# Patient Record
Sex: Male | Born: 1960 | Race: White | Hispanic: No | Marital: Married | Smoking: Former smoker
Health system: Southern US, Community
[De-identification: ages and names within clinical notes are randomized; demographics above are authoritative.]

## PROBLEM LIST (undated history)

## (undated) DIAGNOSIS — I639 Cerebral infarction, unspecified: Secondary | ICD-10-CM

## (undated) DIAGNOSIS — J45909 Unspecified asthma, uncomplicated: Secondary | ICD-10-CM

## (undated) DIAGNOSIS — I10 Essential (primary) hypertension: Secondary | ICD-10-CM

## (undated) HISTORY — DX: Cerebral infarction, unspecified: I63.9

---

## 1998-01-22 HISTORY — PX: ANKLE SURGERY: SHX546

## 2007-01-23 HISTORY — PX: ROTATOR CUFF REPAIR: SHX139

## 2008-12-23 ENCOUNTER — Ambulatory Visit: Payer: Self-pay

## 2009-01-17 ENCOUNTER — Ambulatory Visit: Payer: Self-pay | Admitting: Unknown Physician Specialty

## 2009-01-20 ENCOUNTER — Ambulatory Visit: Payer: Self-pay | Admitting: Unknown Physician Specialty

## 2013-09-22 ENCOUNTER — Inpatient Hospital Stay (HOSPITAL_COMMUNITY)
Admission: EM | Admit: 2013-09-22 | Discharge: 2013-09-25 | DRG: 064 | Disposition: A | Discharge status: Rehabilitation Facility | Payer: PRIVATE HEALTH INSURANCE | Attending: Neurology | Admitting: Neurology

## 2013-09-22 ENCOUNTER — Encounter (HOSPITAL_COMMUNITY): Payer: Self-pay | Admitting: Emergency Medicine

## 2013-09-22 ENCOUNTER — Emergency Department (HOSPITAL_COMMUNITY): Payer: PRIVATE HEALTH INSURANCE

## 2013-09-22 DIAGNOSIS — E785 Hyperlipidemia, unspecified: Secondary | ICD-10-CM | POA: Diagnosis present

## 2013-09-22 DIAGNOSIS — F17200 Nicotine dependence, unspecified, uncomplicated: Secondary | ICD-10-CM | POA: Diagnosis present

## 2013-09-22 DIAGNOSIS — R4701 Aphasia: Secondary | ICD-10-CM | POA: Diagnosis present

## 2013-09-22 DIAGNOSIS — G936 Cerebral edema: Secondary | ICD-10-CM | POA: Diagnosis present

## 2013-09-22 DIAGNOSIS — G4733 Obstructive sleep apnea (adult) (pediatric): Secondary | ICD-10-CM | POA: Diagnosis present

## 2013-09-22 DIAGNOSIS — I619 Nontraumatic intracerebral hemorrhage, unspecified: Secondary | ICD-10-CM | POA: Diagnosis not present

## 2013-09-22 DIAGNOSIS — R471 Dysarthria and anarthria: Secondary | ICD-10-CM | POA: Diagnosis present

## 2013-09-22 DIAGNOSIS — I10 Essential (primary) hypertension: Secondary | ICD-10-CM | POA: Diagnosis present

## 2013-09-22 DIAGNOSIS — G8190 Hemiplegia, unspecified affecting unspecified side: Secondary | ICD-10-CM | POA: Diagnosis present

## 2013-09-22 DIAGNOSIS — Z5189 Encounter for other specified aftercare: Secondary | ICD-10-CM | POA: Diagnosis not present

## 2013-09-22 HISTORY — DX: Essential (primary) hypertension: I10

## 2013-09-22 LAB — COMPREHENSIVE METABOLIC PANEL
ALK PHOS: 75 U/L (ref 39–117)
ALT: 17 U/L (ref 0–53)
AST: 10 U/L (ref 0–37)
Albumin: 3.6 g/dL (ref 3.5–5.2)
Anion gap: 18 — ABNORMAL HIGH (ref 5–15)
BUN: 19 mg/dL (ref 6–23)
CHLORIDE: 100 meq/L (ref 96–112)
CO2: 19 mEq/L (ref 19–32)
Calcium: 8.6 mg/dL (ref 8.4–10.5)
Creatinine, Ser: 1.24 mg/dL (ref 0.50–1.35)
GFR, EST AFRICAN AMERICAN: 74 mL/min — AB (ref >90)
GFR, EST NON AFRICAN AMERICAN: 64 mL/min — AB (ref >90)
GLUCOSE: 113 mg/dL — AB (ref 70–99)
POTASSIUM: 3.7 meq/L (ref 3.7–5.3)
SODIUM: 137 meq/L (ref 137–147)
Total Bilirubin: 0.4 mg/dL (ref 0.3–1.2)
Total Protein: 6.2 g/dL (ref 6.0–8.3)

## 2013-09-22 LAB — DIFFERENTIAL
Basophils Absolute: 0 10*3/uL (ref 0.0–0.1)
Basophils Relative: 1 % (ref 0–1)
EOS ABS: 0.1 10*3/uL (ref 0.0–0.7)
Eosinophils Relative: 1 % (ref 0–5)
LYMPHS ABS: 1.6 10*3/uL (ref 0.7–4.0)
LYMPHS PCT: 24 % (ref 12–46)
MONOS PCT: 7 % (ref 3–12)
Monocytes Absolute: 0.5 10*3/uL (ref 0.1–1.0)
NEUTROS ABS: 4.7 10*3/uL (ref 1.7–7.7)
NEUTROS PCT: 68 % (ref 43–77)

## 2013-09-22 LAB — CBC
HCT: 47.3 % (ref 39.0–52.0)
HEMOGLOBIN: 16.3 g/dL (ref 13.0–17.0)
MCH: 31.6 pg (ref 26.0–34.0)
MCHC: 34.5 g/dL (ref 30.0–36.0)
MCV: 91.7 fL (ref 78.0–100.0)
Platelets: 110 10*3/uL — ABNORMAL LOW (ref 150–400)
RBC: 5.16 MIL/uL (ref 4.22–5.81)
RDW: 13.9 % (ref 11.5–15.5)
WBC: 6.9 10*3/uL (ref 4.0–10.5)

## 2013-09-22 LAB — CBG MONITORING, ED: GLUCOSE-CAPILLARY: 102 mg/dL — AB (ref 70–99)

## 2013-09-22 LAB — PROTIME-INR
INR: 1.14 (ref 0.00–1.49)
Prothrombin Time: 14.6 seconds (ref 11.6–15.2)

## 2013-09-22 LAB — I-STAT TROPONIN, ED: Troponin i, poc: 0.12 ng/mL (ref 0.00–0.08)

## 2013-09-22 LAB — APTT: aPTT: 26 seconds (ref 24–37)

## 2013-09-22 MED ORDER — ACETAMINOPHEN 650 MG RE SUPP: 650.0000 mg | RECTAL | Status: DC | PRN

## 2013-09-22 MED ORDER — STROKE: EARLY STAGES OF RECOVERY BOOK
Freq: Once | Status: AC
  Administered 2013-09-22: 22:00:00
  Filled 2013-09-22: qty 1

## 2013-09-22 MED ORDER — PANTOPRAZOLE SODIUM 40 MG IV SOLR
40.0000 mg | Freq: Every day | INTRAVENOUS | Status: DC
  Administered 2013-09-23: 40 mg via INTRAVENOUS
  Filled 2013-09-22 (×2): qty 40

## 2013-09-22 MED ORDER — ACETAMINOPHEN 325 MG PO TABS: 650.0000 mg | ORAL_TABLET | ORAL | Status: DC | PRN

## 2013-09-22 MED ORDER — LABETALOL HCL 5 MG/ML IV SOLN
10.0000 mg | INTRAVENOUS | Status: DC | PRN
  Administered 2013-09-24 (×2): 20 mg via INTRAVENOUS
  Administered 2013-09-25: 40 mg via INTRAVENOUS
  Administered 2013-09-25: 20 mg via INTRAVENOUS
  Filled 2013-09-22: qty 8
  Filled 2013-09-22 (×3): qty 4

## 2013-09-22 MED ORDER — NICARDIPINE HCL IN NACL 20-0.86 MG/200ML-% IV SOLN
3.0000 mg/h | INTRAVENOUS | Status: DC
  Administered 2013-09-22: 3 mg/h via INTRAVENOUS
  Administered 2013-09-23: 6 mg/h via INTRAVENOUS
  Administered 2013-09-23 (×2): 8 mg/h via INTRAVENOUS
  Administered 2013-09-23: 6 mg/h via INTRAVENOUS
  Administered 2013-09-23: 8 mg/h via INTRAVENOUS
  Administered 2013-09-23: 12 mg/h via INTRAVENOUS
  Administered 2013-09-23: 8 mg/h via INTRAVENOUS
  Administered 2013-09-25: 5 mg/h via INTRAVENOUS
  Filled 2013-09-22 (×10): qty 200

## 2013-09-22 MED ORDER — SENNOSIDES-DOCUSATE SODIUM 8.6-50 MG PO TABS
1.0000 | ORAL_TABLET | Freq: Two times a day (BID) | ORAL | Status: DC
  Administered 2013-09-23 – 2013-09-24 (×3): 1 via ORAL
  Filled 2013-09-22 (×6): qty 1

## 2013-09-22 MED ORDER — LABETALOL HCL 5 MG/ML IV SOLN
10.0000 mg | Freq: Once | INTRAVENOUS | Status: AC
  Administered 2013-09-22: 10 mg via INTRAVENOUS
  Filled 2013-09-22: qty 4

## 2013-09-22 NOTE — Consult Note (Signed)
Referring Physician: ED    Chief Complaint: code stroke, right hemiparesis, dyasarthria  HPI:                                                                                                                                         Jay Wise is an 53 y.o. male with a past medical history significant for untreated HTN, smoking, alcohol use, brought in by EMS as a code stroke due to acute onset of the above stated symtoms. Wife is at the bedside and tells me that Jay Wise just got home, went to the bathroom, and when he came out was weak in the right side and with very slurred speech. She said that he was alert and did not complain of HA, vertigo, double vision, confusion, visual disturbances, CP, SOB, or palpitations. Wife said that he reluctantly allow her to call EMS who report that he was dysarthric with right hemiparesis, and SBP 210. NIHSS 10. CT brain revealed a left thalamus 3.3 x 1.7 cm intraventricular extension and mild LEFT-to-RIGHT midline shift but not hydrocephalus. PT/INR, PTT, platelet count pending. At this time, he is alert, awake, following commands, with SBP 248 requiring IV labetalol 10 mg x 2. Date last known well: 09/22/13 Time last known well: 815 pm tPA Given: no, ICH NIHSS: 10  Past Medical History  Diagnosis Date  . Hypertension     No past surgical history on file.  No family history on file. Social History:  reports that he has been smoking.  He does not have any smokeless tobacco history on file. He reports that he drinks alcohol. He reports that he does not use illicit drugs.  Allergies:  Allergies  Allergen Reactions  . Penicillins   . Sulfa Antibiotics     Medications:                                                                                                                           I have reviewed the patient's current medications.  ROS:  History obtained fromchart review and wife  General ROS: negative for - chills, fatigue, fever, night sweats, or weight loss Psychological ROS: negative for - behavioral disorder, hallucinations, memory difficulties, mood swings or suicidal ideation Ophthalmic ROS: negative for - blurry vision, double vision, eye pain or loss of vision ENT ROS: negative for - epistaxis, nasal discharge, oral lesions, sore throat, tinnitus or vertigo Allergy and Immunology ROS: negative for - hives or itchy/watery eyes Hematological and Lymphatic ROS: negative for - bleeding problems, bruising or swollen lymph nodes Endocrine ROS: negative for - galactorrhea, hair pattern changes, polydipsia/polyuria or temperature intolerance Respiratory ROS: negative for - cough, hemoptysis, shortness of breath or wheezing Cardiovascular ROS: negative for - chest pain, dyspnea on exertion, edema or irregular heartbeat Gastrointestinal ROS: negative for - abdominal pain, diarrhea, hematemesis, nausea/vomiting or stool incontinence Genito-Urinary ROS: negative for - dysuria, hematuria, incontinence or urinary frequency/urgency Musculoskeletal ROS: negative for - joint swelling Neurological ROS: as noted in HPI Dermatological ROS: negative for rash and skin lesion changes  Physical exam: pleasant male in no apparent distress. SBP 248, P 82 R 17, afebrile Head: normocephalic. Neck: supple, no bruits, no JVD. Cardiac: no murmurs. Lungs: clear. Abdomen: soft, no tender, no mass. Extremities: no edema.  Neurologic Examination:                                                                                                      General: Mental Status: Alert, oriented x 4. Marked dysarthria.  Able to follow 3 step commands without difficulty. Cranial Nerves: II: Discs flat bilaterally; Visual fields grossly normal, pupils equal, round, reactive to light and accommodation III,IV, VI: ptosis not  present, extra-ocular motions intact bilaterally V,VII: smile symmetric, facial light touch sensation normal bilaterally VIII: hearing normal bilaterally IX,X: gag reflex present XI: bilateral shoulder shrug XII: midline tongue extension without atrophy or fasciculations Motor: Significant for right HP arm greater than leg Tone diminished right side Sensory: Pinprick and light touch slightly diminished right side Deep Tendon Reflexes:  2 all over Plantars: Right: upgoing   Left: downgoing Cerebellar: normal finger-to-nose,  normal heel-to-shin test in the left. Unable to perform in the right due to weakness. Gait:  No tested    Results for orders placed during the hospital encounter of 09/22/13 (from the past 48 hour(s))  CBG MONITORING, ED     Status: Abnormal   Collection Time    09/22/13  9:27 PM      Result Value Ref Range   Glucose-Capillary 102 (*) 70 - 99 mg/dL   Comment 1 Documented in Chart     Comment 2 Notify RN    Rosezena Sensor, ED     Status: Abnormal   Collection Time    09/22/13  9:36 PM      Result Value Ref Range   Troponin i, poc 0.12 (*) 0.00 - 0.08 ng/mL   Comment NOTIFIED PHYSICIAN     Comment 3            Comment: Due to the release kinetics of cTnI,  a negative result within the first hours     of the onset of symptoms does not rule out     myocardial infarction with certainty.     If myocardial infarction is still suspected,     repeat the test at appropriate intervals.   Ct Head (brain) Wo Contrast  09/22/2013   CLINICAL DATA:  Code stroke, RIGHT-sided weakness, LEFT facial droop  EXAM: CT HEAD WITHOUT CONTRAST  TECHNIQUE: Contiguous axial images were obtained from the base of the skull through the vertex without intravenous contrast.  COMPARISON:  None.  FINDINGS: Generalized atrophy.  Scattered small vessel chronic ischemic changes of deep cerebral white matter.  Large area of intraparenchymal hemorrhage identified, question originating from  LEFT thalamus extending into white matter, measuring approximately 3.3 x 1.7 cm image 17.  Intraventricular extension of hemorrhage into LEFT lateral ventricle.  5 mm of LEFT-to-RIGHT midline shift.  No definite hydrocephalus.  Question small old white matter infarct RIGHT periventricular.  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  No additional intracranial hemorrhage or extra-axial collections.  No focal mass lesion or additional evidence of infarction.  Bones and sinuses unremarkable.  IMPRESSION: Acute intraparenchymal hemorrhage at LEFT thalamus 3.3 x 1.7 cm with evidence of intraventricular extension and mild LEFT-to-RIGHT midline shift.  Small vessel chronic ischemic changes of deep cerebral white matter.  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  Critical Value/emergent results were called by telephone at the time of interpretation on 09/22/2013 at 9:40 pm to Dr. Leroy Kennedy, who verbally acknowledged these results.   Electronically Signed   By: Ulyses Southward M.D.   On: 09/22/2013 21:43    Assessment: 53 y.o. male with acute onset right hemiparesis and dysarthria in the context of hypertensive left BG ICH. Preserved mental status, no hydrocephalus or significant mass effect on CT. Admit to NICU. IV nicardipine with goal SBP < 140. No antiplatelets/anticioagulants. CT brain in am. Supportive treatment. Stroke team will follow up in am.  Stroke Risk Factors - HTN, smoking    Wyatt Portela, MD [facility] [phone]  09/22/2013, 9:50 PM

## 2013-09-22 NOTE — ED Provider Notes (Signed)
CSN: [id number]     Arrival date & time 09/22/13  2126 History   First MD Initiated Contact with Patient 09/22/13 2127     Chief Complaint  Patient presents with  . Code Stroke    HPI Jay Wise is a 53 y.o. male presents via EMS after acute onset of left facial droop and RUE weakness at 2015, 1hr PTA.  CODE STROKE called. Last known normal 2015.  Wife noted facial droop and right arm weakness with slurred speech.  No HA or vision changes. FSBG wnl.  HTN per EMS to SBP 210.      Past Medical History  Diagnosis Date  . Hypertension    No past surgical history on file. No family history on file. History  Substance Use Topics  . Smoking status: Not on file  . Smokeless tobacco: Not on file  . Alcohol Use: Not on file    Review of Systems  Level 5 acuity.  Allergies  Review of patient's allergies indicates not on file.  Home Medications   Prior to Admission medications   Not on File   BP 189/113  Pulse 63  Temp(Src) 98.1 F (36.7 C) (Oral)  Resp 26  SpO2 95% Physical Exam  Nursing note and vitals reviewed. Constitutional: He is oriented to person, place, and time. He appears well-developed and well-nourished.  HENT:  Head: Normocephalic and atraumatic.  Nose: Nose normal.  Eyes: Conjunctivae and EOM are normal. Pupils are equal, round, and reactive to light.  Neck: Normal range of motion. Neck supple. No tracheal deviation present.  Cardiovascular: Normal rate, regular rhythm and normal heart sounds.   No murmur heard. Pulmonary/Chest: Effort normal and breath sounds normal. No respiratory distress. He has no rales.  Abdominal: Soft. Bowel sounds are normal. He exhibits no distension and no mass. There is no tenderness.  Musculoskeletal: Normal range of motion. He exhibits no edema.  Neurological: He is alert and oriented to person, place, and time.  Left facial droop.  Decreased tone on right side.  Weakness of right upper extremity 4/5 compared to normal left.   CN 3-12 intact. Toes upgoing on right, down left. Normal FTN.   Skin: Skin is warm and dry. No rash noted.  Psychiatric: He has a normal mood and affect.   ED Course  Procedures (including critical care time) Labs Review Labs Reviewed  CBC - Abnormal; Notable for the following:    Platelets 110 (*)    All other components within normal limits  COMPREHENSIVE METABOLIC PANEL - Abnormal; Notable for the following:    Glucose, Bld 113 (*)    GFR calc non Af Amer 64 (*)    GFR calc Af Amer 74 (*)    Anion gap 18 (*)    All other components within normal limits  CBG MONITORING, ED - Abnormal; Notable for the following:    Glucose-Capillary 102 (*)    All other components within normal limits  I-STAT TROPOININ, ED - Abnormal; Notable for the following:    Troponin i, poc 0.12 (*)    All other components within normal limits  PROTIME-INR  APTT  DIFFERENTIAL    Imaging Review Ct Head (brain) Wo Contrast  09/22/2013   CLINICAL DATA:  Code stroke, RIGHT-sided weakness, LEFT facial droop  EXAM: CT HEAD WITHOUT CONTRAST  TECHNIQUE: Contiguous axial images were obtained from the base of the skull through the vertex without intravenous contrast.  COMPARISON:  None.  FINDINGS: Generalized atrophy.  Scattered  small vessel chronic ischemic changes of deep cerebral white matter.  Large area of intraparenchymal hemorrhage identified, question originating from LEFT thalamus extending into white matter, measuring approximately 3.3 x 1.7 cm image 17.  Intraventricular extension of hemorrhage into LEFT lateral ventricle.  5 mm of LEFT-to-RIGHT midline shift.  No definite hydrocephalus.  Question small old white matter infarct RIGHT periventricular.  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  No additional intracranial hemorrhage or extra-axial collections.  No focal mass lesion or additional evidence of infarction.  Bones and sinuses unremarkable.  IMPRESSION: Acute intraparenchymal hemorrhage at LEFT  thalamus 3.3 x 1.7 cm with evidence of intraventricular extension and mild LEFT-to-RIGHT midline shift.  Small vessel chronic ischemic changes of deep cerebral white matter.  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  Critical Value/emergent results were called by telephone at the time of interpretation on 09/22/2013 at 9:40 pm to Dr. Leroy Kennedy, who verbally acknowledged these results.   Electronically Signed   By: Ulyses Southward M.D.   On: 09/22/2013 21:43     EKG Interpretation   Date/Time:  Tuesday September 22 2013 22:17:55 EDT Ventricular Rate:  66 PR Interval:  171 QRS Duration: 118 QT Interval:  428 QTC Calculation: 448 R Axis:   87 Text Interpretation:  Sinus rhythm Probable left atrial enlargement LVH  with secondary repolarization abnormality ST depr, consider ischemia,  inferior leads Anterior ST elevation, probably due to LVH Sinus rhythm T  wave inversion Left ventricular hypertrophy Abnormal ekg Confirmed by  Gerhard Munch  MD [phone]) on 09/22/2013 10:50:39 PM      MDM   Final diagnoses:  Stroke due to intracerebral hemorrhage  Severe hypertension   Neurology co managed patient.  Airway intact. FSBG wnl. Markedly HTN on arrival, labetalol given. Last known normal 2000.  CT immediately made available.  Pt with left hemorrhage of thalamus with L-R shift. No recent trauma. BP decreased by 25%.  Exam did not change during ED course.  Admit to NICU, will start nifedipine gtt.   Sofie Rower, MD 09/23/13 [phone]

## 2013-09-22 NOTE — Progress Notes (Signed)
53 yo males, last seen well at 2015 per wife who then witnessed him develop left sided facial droop and right sided weakness. Pt arrived to [facility] ED awake alert with left sided facial droop at ED bay. Pt emergent taken to CT scan which reveal a left thalmus hemorrhage per Dr. Cyril Mourning in CT suite. Once in ED room pt able to state name and birthday but had several garbled speech otherwise and moderate aphasia. NIHSS 10 for mild sensory deficit on right side, weakness in right limbs,mild aphasia and dysarthria. Left side droop not seen during NIHSS. Per wife pt has history of HTN only, and does not take any home meds. He admits to being a smoker. Pt hypertensive, labetalol ordered and given. Cardene gtt ordered to be started. Pt for admit to Neuro ICU tonight per Neurology. Supportive care tonight and CT for AM to reassess hemorrage.

## 2013-09-22 NOTE — ED Notes (Signed)
Patient arrived via [facility] from home. Patient was witnessed at 2015 to develop Left facial droop with Right sided weakness. Patient was hypertensive 210/105.

## 2013-09-22 NOTE — ED Notes (Signed)
Report called to Selena Batten, RN unit 28M.

## 2013-09-23 ENCOUNTER — Encounter (HOSPITAL_COMMUNITY): Payer: Self-pay | Admitting: *Deleted

## 2013-09-23 ENCOUNTER — Inpatient Hospital Stay (HOSPITAL_COMMUNITY): Payer: PRIVATE HEALTH INSURANCE

## 2013-09-23 DIAGNOSIS — I10 Essential (primary) hypertension: Secondary | ICD-10-CM

## 2013-09-23 DIAGNOSIS — I517 Cardiomegaly: Secondary | ICD-10-CM

## 2013-09-23 LAB — BASIC METABOLIC PANEL
Anion gap: 13 (ref 5–15)
BUN: 15 mg/dL (ref 6–23)
CHLORIDE: 103 meq/L (ref 96–112)
CO2: 23 mEq/L (ref 19–32)
Calcium: 8.7 mg/dL (ref 8.4–10.5)
Creatinine, Ser: 1.17 mg/dL (ref 0.50–1.35)
GFR calc Af Amer: 79 mL/min — ABNORMAL LOW (ref >90)
GFR calc non Af Amer: 69 mL/min — ABNORMAL LOW (ref >90)
Glucose, Bld: 113 mg/dL — ABNORMAL HIGH (ref 70–99)
POTASSIUM: 3.9 meq/L (ref 3.7–5.3)
SODIUM: 139 meq/L (ref 137–147)

## 2013-09-23 LAB — TROPONIN I
Troponin I: <0.3 ng/mL (ref <0.30)
Troponin I: 0.41 ng/mL (ref <0.30)
Troponin I: 0.37 ng/mL (ref <0.30)

## 2013-09-23 LAB — LIPID PANEL
CHOL/HDL RATIO: 3.8 ratio
CHOLESTEROL: 169 mg/dL (ref 0–200)
HDL: 44 mg/dL (ref >39)
LDL Cholesterol: 109 mg/dL — ABNORMAL HIGH (ref 0–99)
Triglycerides: 78 mg/dL (ref <150)
VLDL: 16 mg/dL (ref 0–40)

## 2013-09-23 LAB — TSH: TSH: 0.812 u[IU]/mL (ref 0.350–4.500)

## 2013-09-23 LAB — CK TOTAL AND CKMB (NOT AT ARMC)
CK, MB: 2.8 ng/mL (ref 0.3–4.0)
Relative Index: 0.9 (ref 0.0–2.5)
Total CK: 313 U/L — ABNORMAL HIGH (ref 7–232)

## 2013-09-23 LAB — HEMOGLOBIN A1C
HEMOGLOBIN A1C: 5.6 % (ref <5.7)
Mean Plasma Glucose: 114 mg/dL (ref <117)

## 2013-09-23 LAB — MRSA PCR SCREENING: MRSA BY PCR: NEGATIVE

## 2013-09-23 LAB — T4, FREE: FREE T4: 1.07 ng/dL (ref 0.80–1.80)

## 2013-09-23 MED ORDER — LABETALOL HCL 200 MG PO TABS
200.0000 mg | ORAL_TABLET | Freq: Three times a day (TID) | ORAL | Status: DC
  Administered 2013-09-23 (×2): 200 mg via ORAL
  Filled 2013-09-23 (×5): qty 1

## 2013-09-23 MED ORDER — PANTOPRAZOLE SODIUM 40 MG PO TBEC
40.0000 mg | DELAYED_RELEASE_TABLET | Freq: Every day | ORAL | Status: DC
  Administered 2013-09-23 – 2013-09-24 (×2): 40 mg via ORAL
  Filled 2013-09-23 (×2): qty 1

## 2013-09-23 MED ORDER — LISINOPRIL 20 MG PO TABS
20.0000 mg | ORAL_TABLET | Freq: Two times a day (BID) | ORAL | Status: DC
  Administered 2013-09-23 – 2013-09-25 (×4): 20 mg via ORAL
  Filled 2013-09-23 (×6): qty 1

## 2013-09-23 MED ORDER — PNEUMOCOCCAL VAC POLYVALENT 25 MCG/0.5ML IJ INJ
0.5000 mL | INJECTION | INTRAMUSCULAR | Status: AC
  Administered 2013-09-24: 0.5 mL via INTRAMUSCULAR
  Filled 2013-09-23: qty 0.5

## 2013-09-23 MED ORDER — LISINOPRIL 20 MG PO TABS
20.0000 mg | ORAL_TABLET | Freq: Every day | ORAL | Status: DC
  Administered 2013-09-23: 20 mg via ORAL
  Filled 2013-09-23: qty 1

## 2013-09-23 MED ORDER — AMLODIPINE BESYLATE 10 MG PO TABS
10.0000 mg | ORAL_TABLET | Freq: Every day | ORAL | Status: DC
  Administered 2013-09-23 – 2013-09-25 (×3): 10 mg via ORAL
  Filled 2013-09-23 (×3): qty 1

## 2013-09-23 NOTE — Plan of Care (Signed)
Noted that pt troponin was elevated. Actually his troponin was elevated when he was in ER. Repeat EKG showed no significant changes compare to prior. Troponin elevation has been reported after CVA. The proposed mechanisms regarding troponin release in the early phase of cerebrovascular disease include cardiac stress in the setting of CVA, secondary central nervous system activation to primary cerebral ischemia, and cerebral disease-related heart failure. Will continue to trend troponin and CK with CK-MB q6h.   Marvel Plan, MD PhD Stroke Neurology 09/23/2013 9:12 PM

## 2013-09-23 NOTE — ED Provider Notes (Signed)
This patient was seen in conjunction with the resident physician.  The documentation accurately reflects the patient's ED encounter.  On my exam, the patient had a new facial droop, new weakness in his right upper extremity.  Patient's last normal time was approximately 1.5 hours prior to our initial evaluation, which was conducted on the patient's arrival to the emergency department. Notably, the patient has no history of hypertension, but his initial blood pressure was substantially elevated, with a systolic of greater than 200. Patient's care was coordinated after arrival with our neurology team as a code stroke. Patient was taken expeditiously to CT scan. CT scan, reviewed by [facility], and I agree with the interpretation, demonstrates new intracranial hemorrhage with left to right midline shift.  Patient's EKG shows a ventricular rate of 66, sinus, LVH, ST depressions, abnormal A second EKG showed similar characteristics, both of which were abnormal.  Given the hypertension, patient received labetalol, coordinated with neurology, with a decrease in his blood pressure. Given the intracranial hemorrhage, acute stroke, patient required admission to the neurosurgical intensive care.  CRITICAL CARE Performed by: Gerhard Munch Total critical care time: 35 Critical care time was exclusive of separately billable procedures and treating other patients. Critical care was necessary to treat or prevent imminent or life-threatening deterioration. Critical care was time spent personally by me on the following activities: development of treatment plan with patient and/or surrogate as well as nursing, discussions with consultants, evaluation of patient's response to treatment, examination of patient, obtaining history from patient or surrogate, ordering and performing treatments and interventions, ordering and review of laboratory studies, ordering and review of radiographic studies, pulse oximetry and  re-evaluation of patient's condition.    Gerhard Munch, MD 09/23/13 [phone]

## 2013-09-23 NOTE — Progress Notes (Signed)
  Echocardiogram 2D Echocardiogram has been performed.  Leta Jungling M 09/23/2013, 2:05 PM

## 2013-09-23 NOTE — Progress Notes (Signed)
eLink Physician-Brief Progress Note Patient Name: Jay Wise DOB: Mar 13, 1960 MRN: [id number]   Date of Service  09/23/2013  HPI/Events of Note  53 yo male presented with dysarthria and Rt sided weakness.  SBP in ER 210.  CT head showed Lt thalamus hemorrhage.  IV cardene started with improvement in hemodynamics.  No issues with oxygenation.   eICU Interventions  Continue monitoring on current therapy.        [name] 09/23/2013, 2:17 AM

## 2013-09-23 NOTE — Progress Notes (Signed)
UR completed.  Paola Aleshire, RN BSN MHA CCM Trauma/Neuro ICU Case Manager [phone]  

## 2013-09-23 NOTE — Progress Notes (Signed)
Dr. Roda Shutters updated on pt still requiring Cardene at 80 mg/hr for SBP 150s. Oral BP meds given earlier. Oral BP meds adjusted by MD. Will continue to monitor and attempt to wean off drip for SBP <160.    Holly Bodily

## 2013-09-23 NOTE — Progress Notes (Signed)
PT Cancellation Note  Patient Details Name: Jay Wise MRN: [id number] DOB: May 21, 1960   Cancelled Treatment:    Reason Eval/Treat Not Completed: Patient not medically ready (on bedrest, will see once activity updated)   Fabio Asa 09/23/2013, 8:58 AM Charlotte Crumb, PT DPT  [phone]

## 2013-09-23 NOTE — Progress Notes (Signed)
STROKE TEAM PROGRESS NOTE   HISTORY Jay Wise is an 53 y.o. male with a past medical history significant for untreated HTN, smoking, alcohol use, brought in by EMS as a code stroke due to right hemiparesis and dysarthria. Wife is at the bedside and tells me that Jay Wise just got home, went to the bathroom, and when he came out was weak in the right side and with very slurred speech. She said that he was alert and did not complain of HA, vertigo, double vision, confusion, visual disturbances, CP, SOB, or palpitations. Wife said that he reluctantly allow her to call EMS who report that he was dysarthric with right hemiparesis, and SBP 210. NIHSS 10. CT brain revealed a left thalamus 3.3 x 1.7 cm intraventricular extension and mild LEFT-to-RIGHT midline shift but not hydrocephalus.  PT/INR, PTT, platelet count pending. At time of assessment, he is alert, awake, following commands, with SBP 248 requiring IV labetalol 10 mg x 2. Last known well: 09/22/13 at 815 pm. Patient was not administered TPA secondary to hemorrhage. He was admitted to the neuro ICU for further evaluation and treatment.   SUBJECTIVE (INTERVAL HISTORY) His wife is at the bedside.  Overall he feels his condition is gradually improving. His right side muscle strength is better and speech is more fluent than yesterday.  OBJECTIVE  Recent Labs Lab 09/22/13 2127  GLUCAP 102*    Recent Labs Lab 09/22/13 2126  NA 137  K 3.7  CL 100  CO2 19  GLUCOSE 113*  BUN 19  CREATININE 1.24  CALCIUM 8.6    Recent Labs Lab 09/22/13 2126  AST 10  ALT 17  ALKPHOS 75  BILITOT 0.4  PROT 6.2  ALBUMIN 3.6    Recent Labs Lab 09/22/13 2126  WBC 6.9  NEUTROABS 4.7  HGB 16.3  HCT 47.3  MCV 91.7  PLT 110*    Recent Labs Lab 09/23/13 0741  TROPONINI <0.30    Recent Labs  09/22/13 2126  LABPROT 14.6  INR 1.14   No results found for this basename: COLORURINE, APPERANCEUR, LABSPEC, PHURINE, GLUCOSEU, HGBUR,  BILIRUBINUR, KETONESUR, PROTEINUR, UROBILINOGEN, NITRITE, LEUKOCYTESUR,  in the last 72 hours  No results found for this basename: chol, trig, hdl, cholhdl, vldl, ldlcalc   No results found for this basename: HGBA1C   No results found for this basename: labopia, cocainscrnur, labbenz, amphetmu, thcu, labbarb    No results found for this basename: ETH,  in the last 168 hours  Ct Head (brain) Wo Contrast  09/23/2013 Left thalamic hematoma unchanged. Intraventricular hemorrhage again  noted without hydrocephalus. Mild midline shift to the right  measuring 5 mm unchanged.  09/22/2013   Acute intraparenchymal hemorrhage at LEFT thalamus 3.3 x 1.7 cm with evidence of intraventricular extension and mild LEFT-to-RIGHT midline shift.  Small vessel chronic ischemic changes of deep cerebral white matter.  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    2D echo - Left ventricle: The cavity size was normal. Wall thickness was increased in a pattern of severe LVH. Systolic function was normal. The estimated ejection fraction was in the range of 50% to 55%. Wall motion was normal; there were no regional wall motion abnormalities. - Left atrium: The atrium was moderately dilated. - Right atrium: The atrium was moderately dilated.  CUS pending  PHYSICAL EXAM  Temp:  [97.8 F (36.6 C)-98.1 F (36.7 C)] 97.8 F (36.6 C) (09/02 0800) Pulse Rate:  [62-96] 80 (09/02 0830) Resp:  [17-26]  17 (09/02 0830) BP: (138-242)/(72-148) 178/104 mmHg (09/02 0830) SpO2:  [90 %-98 %] 98 % (09/02 0830) Weight:  [196 lb 10.4 oz (89.2 kg)] 196 lb 10.4 oz (89.2 kg) (09/02 0020)  General - Well nourished, well developed, in no apparent distress.  Ophthalmologic - Sharp disc margins OU.  Cardiovascular - Regular rate and rhythm with no murmur.  Mental Status -  Level of arousal and orientation to time, place, and person were intact. Language including expression, naming, repetition,  comprehension was assessed and found to have mild transcortical motor aphasia, able to name and repeat. Attention span and concentration were normal. Fund of Knowledge was assessed and was intact.  Cranial Nerves II - XII - II - Visual field intact OU. III, IV, VI - Extraocular movements intact. V - Facial sensation slightly decreased on the right. VII - mild right nasolabial fold flattening. VIII - Hearing & vestibular intact bilaterally. X - Palate elevates symmetrically. XI - Chin turning & shoulder shrug intact bilaterally. XII - Tongue protrusion intact.  Motor Strength - The patient's strength was 4+/5 RUE and pronator drift was present. 5-/5 RLE. Bulk was normal and fasciculations were absent.   Motor Tone - Muscle tone was assessed at the neck and appendages and was normal.  Reflexes - The patient's reflexes were normal in all extremities and he had no pathological reflexes.  Sensory - Light touch, temperature/pinprick were assessed and were slightly decreased on the right.    Coordination - The patient had normal movements in the hands and feet with no ataxia or dysmetria.  Tremor was absent.  Gait and Station - not tested.   ASSESSMENT/PLAN  Jay Wise is a 53 y.o. male with hx of untreated HTN, smoking, alcohol use, brought in by EMS as a code stroke due to right hemiparesis and dysarthria.  He did not receive IV t-PA due to hemorrhage. 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 Admitted to the ICU. Stroke work up underway.  Stroke:  left thalamic hemorrhage with IVH, cerebral edema with left to right shift. Hemorrhage secondary to malignant hypertension      no scheduled antithrombotics, took [employer] prn prior to admission  Carotid Doppler  pending  2D Echo with EF 50-55%   Free T4 WNL  HgbA1c 5.6  SCDs for VTE prophylaxis  NPO   Bedrest. Keep in bed today. Therapy evals tomorrow if stable  Therapy  needs:  pending  Ongoing aggressive risk factor management  Disposition:  Pending   Malignant Hypertension   BP on arrival 242/148  Given labetolol and placed on cardene drip  Home meds:  None  Will put on po norvasc, lisinopril and labetalol and try to wean off cardene drip  BP 138-242/76-148 past 24h  SBP goal < 160  Unstable  Hyperlipidemia  Home meds:  None  LDL 109   Hold off statin for now due to ICH  Other Stroke Risk Factors Cigarette smoker, advised to stop smoking Smoking cessation counseling provided ETOH use Undiagnosed Obstructive sleep apnea, needs OP testing  Hospital day # 1  This patient is critically ill due to ICH with HTN uncontrolled and at significant risk of neurological worsening, death form hematoma expansion, cerebral edema and brain herniation. This patient's care requires constant monitoring of vital signs, hemodynamics, respiratory and cardiac monitoring, review of multiple databases, neurological assessment, discussion with family, other specialists and medical decision making of high complexity. I spent 40 minutes of  neurocritical care time in the care of this patient.  Marvel Plan, MD PhD Stroke Neurology 09/23/2013 4:23 PM  To contact Stroke Continuity provider, please refer to [email or web address]. After hours, contact General Neurology

## 2013-09-23 NOTE — Progress Notes (Signed)
Dr. Roda Shutters notified of critical value Troponin of 0.41.  He said he would order another STAT EKG and continue to monitor troponins q8. Tia Gelb C

## 2013-09-23 NOTE — Progress Notes (Signed)
OT Cancellation Note  Patient Details Name: WALLIS VANCOTT MRN: [id number] DOB: [birth year]   Cancelled Treatment:    Reason Eval/Treat Not Completed: Patient not medically ready (strict bedrest)  Harolyn Rutherford Pager: [phone]  09/23/2013, 8:01 AM

## 2013-09-23 NOTE — Progress Notes (Signed)
CRITICAL VALUE ALERT  Critical value received:  Troponin, 0.41  Date of notification:  09/23/2013  Time of notification:  1622  Critical value read back:Yes.    Nurse who received alert:  Berniece Pap, RN  MD notified (1st page):  Dr. Jerel Shepherd  Time of first page:  1627  MD notified (2nd page):  Time of second page:  Responding MD:  Dr. Jerel Shepherd  Time MD responded:  [phone]

## 2013-09-24 DIAGNOSIS — I619 Nontraumatic intracerebral hemorrhage, unspecified: Secondary | ICD-10-CM

## 2013-09-24 DIAGNOSIS — G8110 Spastic hemiplegia affecting unspecified side: Secondary | ICD-10-CM

## 2013-09-24 DIAGNOSIS — I63239 Cerebral infarction due to unspecified occlusion or stenosis of unspecified carotid arteries: Secondary | ICD-10-CM

## 2013-09-24 DIAGNOSIS — I10 Essential (primary) hypertension: Secondary | ICD-10-CM

## 2013-09-24 LAB — CK TOTAL AND CKMB (NOT AT ARMC)
CK TOTAL: 264 U/L — AB (ref 7–232)
CK TOTAL: 223 U/L (ref 7–232)
CK TOTAL: 188 U/L (ref 7–232)
CK, MB: 2.2 ng/mL (ref 0.3–4.0)
CK, MB: 2 ng/mL (ref 0.3–4.0)
CK, MB: 2.1 ng/mL (ref 0.3–4.0)
Relative Index: 0.8 (ref 0.0–2.5)
Relative Index: 0.9 (ref 0.0–2.5)
Relative Index: 1.1 (ref 0.0–2.5)

## 2013-09-24 LAB — TROPONIN I
TROPONIN I: 0.37 ng/mL — AB (ref <0.30)
TROPONIN I: 0.3 ng/mL — AB (ref <0.30)
Troponin I: <0.3 ng/mL (ref <0.30)

## 2013-09-24 MED ORDER — HEPARIN SODIUM (PORCINE) 5000 UNIT/ML IJ SOLN
5000.0000 [IU] | Freq: Three times a day (TID) | INTRAMUSCULAR | Status: DC
  Administered 2013-09-25: 5000 [IU] via SUBCUTANEOUS
  Filled 2013-09-24 (×4): qty 1

## 2013-09-24 MED ORDER — LABETALOL HCL 300 MG PO TABS
300.0000 mg | ORAL_TABLET | Freq: Three times a day (TID) | ORAL | Status: DC
  Administered 2013-09-24 – 2013-09-25 (×5): 300 mg via ORAL
  Filled 2013-09-24 (×6): qty 1

## 2013-09-24 MED ORDER — HYDRALAZINE HCL 25 MG PO TABS
25.0000 mg | ORAL_TABLET | Freq: Three times a day (TID) | ORAL | Status: DC
  Administered 2013-09-24 – 2013-09-25 (×2): 25 mg via ORAL
  Filled 2013-09-24 (×5): qty 1

## 2013-09-24 NOTE — Evaluation (Signed)
Occupational Therapy Evaluation Patient Details Name: Jay Wise MRN: [id number] DOB: Nov 19, 1960 Today's Date: 09/24/2013    History of Present Illness [name] is a 53 y.o. male with hx of untreated HTN, smoking, alcohol use, brought in by EMS as a code stroke due to right hemiparesis and dysarthria.  He did not receive IV t-PA due to hemorrhage. 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   Clinical Impression   Pt was independent PTA and worked in [employer].  He now presents with mild weakness, inattention, and incoordination on the R interfering with ability to perform ADL and ADL transfers.  Pt also demonstrates cognitive and perceptual deficits.  Will benefit from intense rehab prior to return home.  Pt has excellent rehab potential.     Follow Up Recommendations  CIR;Supervision/Assistance - 24 hour    Equipment Recommendations       Recommendations for Other Services Rehab consult     Precautions / Restrictions Precautions Precautions: Fall      Mobility Bed Mobility Overal bed mobility: Needs Assistance Bed Mobility: Supine to Sit     Supine to sit: Supervision        Transfers Overall transfer level: Needs assistance Equipment used: 2 person hand held assist Transfers: Sit to/from Stand;Stand Pivot Transfers Sit to Stand: Min assist;+2 physical assistance Stand pivot transfers: Mod assist;+2 physical assistance       General transfer comment: Patient with poor coordination of RLE during stride to chair, inability to recognize placement of LE (over exagerated stride) moderate assist for stablity and safety.    Balance                                            ADL Overall ADL's : Needs assistance/impaired Eating/Feeding: Set up;Sitting (with L hand )   Grooming: Wash/dry hands;Wash/dry face;Minimal assistance;Sitting   Upper Body Bathing: Minimal assitance;Sitting   Lower Body  Bathing: Sit to/from stand;Minimal assistance   Upper Body Dressing : Minimal assistance;Sitting   Lower Body Dressing: Minimal assistance;Sit to/from stand Lower Body Dressing Details (indicate cue type and reason): donned his socks independently without LOB Toilet Transfer: Moderate assistance;+2 for physical assistance;Stand-pivot (to recliner) Toilet Transfer Details (indicate cue type and reason): toward R side         Functional mobility during ADLs: Moderate assistance General ADL Comments: Balance, incoordination and inattention interfering with ADL.     Vision                     Perception Perception Perception Tested?: Yes Perception Deficits: Inattention/neglect Inattention/Neglect: Impaired- to be further tested in functional context (inpaired attention to R side) Comments: poor L /R orientation   Praxis      Pertinent Vitals/Pain Pain Assessment: No/denies pain     Hand Dominance Right   Extremity/Trunk Assessment Upper Extremity Assessment Upper Extremity Assessment: RUE deficits/detail RUE Deficits / Details: 4/5 strength, mild edema in hand, spontaneously uses R UE in bilateral tasks. RUE Sensation: decreased proprioception (inattention, needs further assessment) RUE Coordination: decreased fine motor;decreased gross motor   Lower Extremity Assessment Lower Extremity Assessment: Defer to PT evaluation RLE Deficits / Details: weakness in strength testing 4/5 RLE strength, increased weakness with functional mobility RLE Sensation: decreased proprioception RLE Coordination: decreased fine motor;decreased gross motor       Communication Communication Communication:  Expressive difficulties;Receptive difficulties   Cognition Arousal/Alertness: Awake/alert Behavior During Therapy: WFL for tasks assessed/performed Overall Cognitive Status: Impaired/Different from baseline Area of Impairment: Orientation;Attention;Memory;Following  commands;Safety/judgement;Awareness;Problem solving Orientation Level: Person;Place;Time;Situation (required VCs and choices to express proper responses) Current Attention Level: Alternating   Following Commands: Follows one step commands consistently Safety/Judgement: Decreased awareness of safety;Decreased awareness of deficits Awareness: Anticipatory Problem Solving: Requires verbal cues;Requires tactile cues General Comments: Patient with difficulty distinguishing left/right discrimination.   General Comments       Exercises       Shoulder Instructions      Home Living Family/patient expects to be discharged to:: Private residence Living Arrangements: Spouse/significant other Available Help at Discharge: Family;Available 24 hours/day Type of Home: House Home Access: Stairs to enter [employer] of Steps: 4 Entrance Stairs-Rails: Right Home Layout: Two level;1/2 bath on main level Alternate Level Stairs-Number of Steps: 1 flight Alternate Level Stairs-Rails: Left Bathroom Shower/Tub: Producer, television/film/video: Standard     Home Equipment: None   Additional Comments: sales job  Lives With: Spouse    Prior Functioning/Environment Level of Independence: Independent             OT Diagnosis: Generalized weakness;Cognitive deficits;Hemiplegia dominant side   OT Problem List: Decreased strength;Decreased activity tolerance;Impaired balance (sitting and/or standing);Impaired vision/perception;Decreased coordination;Decreased cognition;Decreased safety awareness;Decreased knowledge of use of DME or AE;Impaired sensation;Impaired UE functional use;Increased edema   OT Treatment/Interventions: Self-care/ADL training;Neuromuscular education;Energy conservation;DME and/or AE instruction;Therapeutic activities;Patient/family education;Balance training;Cognitive remediation/compensation;Visual/perceptual remediation/compensation    OT Goals(Current goals can  be found in the care plan section) Acute Rehab OT Goals Patient Stated Goal: to do whatever it takes OT Goal Formulation: With patient Time For Goal Achievement: 10/08/13 Potential to Achieve Goals: Good ADL Goals Pt Will Perform Eating: with supervision;with adaptive utensils;sitting (with R hand) Pt Will Perform Grooming: with supervision;standing Pt Will Perform Upper Body Bathing: with supervision;sitting Pt Will Perform Lower Body Bathing: with supervision;sit to/from stand Pt Will Perform Upper Body Dressing: with supervision;sitting Pt Will Perform Lower Body Dressing: with supervision;sit to/from stand Pt Will Transfer to Toilet: with supervision;ambulating Pt Will Perform Toileting - Clothing Manipulation and hygiene: with supervision;sit to/from stand Additional ADL Goal #1: Pt will attend to R side and visual field to prevent injury during mobility and to locate ADL items.  OT Frequency: Min 3X/week   Barriers to D/C:            Co-evaluation PT/OT/SLP Co-Evaluation/Treatment: Yes Reason for Co-Treatment: For patient/therapist safety   OT goals addressed during session: ADL's and self-care      End of Session Nurse Communication: Mobility status (aware of elevated BP, gave meds, bed to chair only)  Activity Tolerance:   Patient left: in chair;with call bell/phone within reach;with family/visitor present   Time: 3244-0102 OT Time Calculation (min): 45 min Charges:  OT General Charges $OT Visit: 1 Procedure OT Evaluation $Initial OT Evaluation Tier I: 1 Procedure OT Treatments $Self Care/Home Management : 23-37 mins G-Codes:    Evern Bio 09/24/2013, 11:47 AM [phone]

## 2013-09-24 NOTE — Evaluation (Signed)
Speech Language Pathology Evaluation Patient Details Name: Jay Wise MRN: [id number] DOB: 16-Sep-1960 Today's Date: 09/24/2013 Time: 0900-0920 SLP Time Calculation (min): 20 min  Problem List:  Patient Active Problem List   Diagnosis Date Noted  . 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 Hypertension    Past Surgical History: History reviewed. No pertinent past surgical history. HPI:  Jay Wise is a 53 y.o. male with hx of untreated HTN, smoking, alcohol use, brought in by EMS as a code stroke due to right hemiparesis and dysarthria. He did not receive IV t-PA due to hemorrhage. Imaging confirms a left thalamic hemorrhage in the setting of malignant hypertension   Assessment / Plan / Recommendation Clinical Impression  Pt demonstrates a moderate expressive aphasia with decreased word finding ability at conversation level and a mild upper motor neuron dysarthria. Language characterized by hesitations, circumlocutions and paraphasias impacting fluency and intelligiblity. SLP provided semantic, phonemic and open ended cues to improve expressive langauge and provided wife with brief instuction for strategies. Pt also demonstrates difficulty storing new information, likely related to difficulty with verbal memory. Pt would benefit from ongoing acute therapy for functional communication, would be an excellent candidate for CIR.     SLP Assessment  Patient needs continued Speech Lanaguage Pathology Services    Follow Up Recommendations  Inpatient Rehab    Frequency and Duration min 2x/week  2 weeks   Pertinent Vitals/Pain Pain Assessment: No/denies pain   SLP Goals  SLP Goals Potential to Achieve Goals: Good Progress/Goals/Alternative treatment plan discussed with pt/caregiver and they: Agree  SLP Evaluation Prior Functioning  Cognitive/Linguistic Baseline: Within functional limits Type of Home:  House  Lives With: Spouse Available Help at Discharge: Family;Available 24 hours/day Vocation: Full time employment   Cognition  Overall Cognitive Status: Impaired/Different from baseline Arousal/Alertness: Awake/alert Orientation Level: Oriented X4 Attention: Focused;Sustained;Selective Focused Attention: Appears intact Sustained Attention: Appears intact Selective Attention: Appears intact Memory: Impaired Memory Impairment: Storage deficit;Retrieval deficit Awareness: Appears intact Problem Solving: Impaired Problem Solving Impairment: Verbal complex    Comprehension  Auditory Comprehension Overall Auditory Comprehension: Impaired Yes/No Questions: Impaired Complex Questions: 50-74% accurate Commands: Impaired Complex Commands: 75-100% accurate (needed repetition) Conversation: Simple Reading Comprehension Reading Status: Impaired Word level: Within functional limits Sentence Level: Impaired    Expression Verbal Expression Overall Verbal Expression: Impaired Initiation: No impairment Automatic Speech: Name;Social Response Level of Generative/Spontaneous Verbalization: Phrase Repetition: Impaired Level of Impairment: Word level (effort, slow rate with multisyllabic words) Naming: Impairment Responsive: 51-75% accurate Confrontation: Impaired Convergent: Not tested Divergent: Not tested Other Naming Comments: hesitation, paraphasia, cicumlocutions at phrase/conversation level Verbal Errors: Phonemic paraphasias;Aware of errors (circumlocution) Pragmatics: No impairment Interfering Components: Speech intelligibility Effective Techniques: Open ended questions;Sentence completion;Phonemic cues;Semantic cues Written Expression Dominant Hand: Right Written Expression: Exceptions to Jay Wise Copy Ability: Word Self Formulation Ability: Word (impaired at word level) Interfering Components: Utilizes non-dominant hand   Oral / Motor Oral Motor/Sensory Function Overall Oral  Motor/Sensory Function: Impaired Labial ROM: Within Functional Limits Labial Symmetry: Abnormal symmetry right Labial Strength: Reduced Labial Sensation: Reduced Lingual ROM: Within Functional Limits Lingual Symmetry: Abnormal symmetry right Lingual Strength: Reduced Lingual Sensation: Reduced Facial ROM: Within Functional Limits Facial Strength: Reduced Facial Sensation: Reduced Motor Speech Overall Motor Speech: Impaired Respiration: Within functional limits Phonation: Normal Resonance: Within functional limits Articulation: Impaired Level of Impairment: Conversation Intelligibility: Intelligible Motor Planning: Witnin functional limits Motor Speech Errors: Consistent  GO    Jay Ditty, MA CCC-SLP [phone]  Jay Wise 09/24/2013, 10:04 AM

## 2013-09-24 NOTE — Evaluation (Signed)
Physical Therapy Evaluation Patient Details Name: YOEL KAUFHOLD MRN: [id number] DOB: [birth year] Today's Date: 09/24/2013   History of Present Illness  [name] is a 53 y.o. male with hx of untreated HTN, smoking, alcohol use, brought in by EMS as a code stroke due to right hemiparesis and dysarthria.  He did not receive IV t-PA due to hemorrhage. 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  Clinical Impression  Patient will need continued skilled PT to address deficits and maximize function. Will see as indicated and progress as tolerated. Recommend CIR upon acute discharge as patient was independent PTA.    Follow Up Recommendations CIR    Equipment Recommendations  Other (comment) (TBD)    Recommendations for Other Services Rehab consult     Precautions / Restrictions Precautions Precautions: Fall      Mobility  Bed Mobility Overal bed mobility: Needs Assistance Bed Mobility: Supine to Sit     Supine to sit: Supervision        Transfers Overall transfer level: Needs assistance Equipment used: 2 person hand held assist Transfers: Sit to/from Stand;Stand Pivot Transfers Sit to Stand: Min assist;+2 physical assistance Stand pivot transfers: Mod assist;+2 physical assistance       General transfer comment: Patient with poor coordination of RLE during stride to chair, inability to recognize placement of LE (over exagerated stride) moderate assist for stablity and safety.  Ambulation/Gait                Stairs            Wheelchair Mobility    Modified Rankin (Stroke Patients Only) Modified Rankin (Stroke Patients Only) Pre-Morbid Rankin Score: No symptoms Modified Rankin: Moderately severe disability     Balance                                             Pertinent Vitals/Pain Pain Assessment: No/denies pain    Home Living Family/patient expects to be discharged to::  Private residence Living Arrangements: Spouse/significant other Available Help at Discharge: Family;Available 24 hours/day Type of Home: House Home Access: Stairs to enter Entrance Stairs-Rails: Right Entrance Stairs-Number of Steps: 4 Home Layout: Two level;1/2 bath on main level Home Equipment: None Additional Comments: sales job    Prior Function Level of Independence: Independent               Hand Dominance   Dominant Hand: Right    Extremity/Trunk Assessment   Upper Extremity Assessment: Defer to OT evaluation           Lower Extremity Assessment: RLE deficits/detail RLE Deficits / Details: weakness in strength testing 4/5 RLE strength, increased weakness with functional mobility       Communication   Communication: Expressive difficulties;Receptive difficulties  Cognition Arousal/Alertness: Awake/alert Behavior During Therapy: WFL for tasks assessed/performed Overall Cognitive Status: Impaired/Different from baseline Area of Impairment: Orientation;Attention;Memory;Following commands;Safety/judgement;Awareness;Problem solving Orientation Level: Person;Place;Time;Situation (required VCs and choices to express proper responses) Current Attention Level: Alternating   Following Commands: Follows one step commands consistently Safety/Judgement: Decreased awareness of safety;Decreased awareness of deficits Awareness: Anticipatory Problem Solving: Requires verbal cues;Requires tactile cues General Comments: Patient with difficulty distinguishing left/right discrimination.    General Comments General comments (skin integrity, edema, etc.): Elevated BP with activity nsg aware, parameters reached limiting further activity    Exercises  Assessment/Plan    PT Assessment Patient needs continued PT services  PT Diagnosis Difficulty walking;Abnormality of gait;Generalized weakness   PT Problem List Decreased strength;Decreased activity  tolerance;Decreased balance;Decreased mobility;Decreased coordination;Decreased cognition;Decreased knowledge of use of DME  PT Treatment Interventions DME instruction;Gait training;Stair training;Functional mobility training;Therapeutic activities;Therapeutic exercise;Balance training;Patient/family education   PT Goals (Current goals can be found in the Care Plan section) Acute Rehab PT Goals Patient Stated Goal: to do whatever it takes PT Goal Formulation: With patient Time For Goal Achievement: 10/08/13 Potential to Achieve Goals: Good    Frequency Min 3X/week   Barriers to discharge        Co-evaluation               End of Session Equipment Utilized During Treatment: Gait belt Activity Tolerance: Patient tolerated treatment well;Treatment limited secondary to medical complications (Comment) (Elevated BP beyond parameters: 169 supine, 175 EOB systolic ) Patient left: in chair;with call bell/phone within reach;with family/visitor present Nurse Communication: Mobility status         Time: 1610-9604 PT Time Calculation (min): 39 min   Charges:   PT Evaluation $Initial PT Evaluation Tier I: 1 Procedure PT Treatments $Therapeutic Activity: 8-22 mins   PT G CodesFabio Asa 09/24/2013, 11:22 AM Charlotte Crumb, PT DPT  [phone]

## 2013-09-24 NOTE — Progress Notes (Signed)
Bilateral carotid artery duplex completed:  1-39% ICA stenosis.  Vertebral artery flow is antegrade.  Renal artery duplex completed:  No evidence of significant renal artery stenosis on the right.  Left renal artery not insonated.

## 2013-09-24 NOTE — Consult Note (Signed)
Physical Medicine and Rehabilitation Consult Reason for Consult: Left thalamic hemorrhage Referring Physician: Dr.Xu   HPI: Jay Wise is a 53 y.o. right-handed male with history of untreated hypertension, tobacco and alcohol abuse. Patient independent prior to admission living with his wife. Admitted 09/22/2013 with right-sided weakness and aphasia. Systolic blood pressure 210. CT of the brain in the left thalamus 3.3 x 1.7 cm intraventricular extension and mild left-to-right midline shift but no hydrocephalus. Followup cranial CT scan 09/23/2013 unchanged. Neurology services consulted for left thalamic hemorrhage felt to be secondary to malignant hypertension. Echocardiogram with ejection fraction 55% no wall motion abnormalities. Carotid Dopplers pending. Noted mild elevation in troponin 0.37 -0.41 felt to be possibly stress ischemia from hemorrhage and monitor. No chest pain or shortness of breath. Patient is on a regular consistency diet. Blood pressure controlled with Cardene drip. Physical and occupational therapy evaluations completed 09/24/2013 with recommendations of physical medicine rehabilitation consult.  Patient just finished carotid Dopplers. Eating a hamburger without any evidence of coughing Review of Systems  Neurological: Positive for headaches.  All other systems reviewed and are negative.  Past Medical History  Diagnosis Date  . Hypertension    History reviewed. No pertinent past surgical history. No family history on file. Social History:  reports that he has been smoking.  He does not have any smokeless tobacco history on file. He reports that he drinks alcohol. He reports that he does not use illicit drugs. Allergies:  Allergies  Allergen Reactions  . Penicillins Other (See Comments)    unknown  . Sulfa Antibiotics Other (See Comments)    unknown   Medications Prior to Admission  Medication Sig Dispense Refill  . Aspirin-Salicylamide-Caffeine (BC  HEADACHE POWDER PO) Take 1 packet by mouth 2 (two) times daily as needed. pain        Home: Home Living Family/patient expects to be discharged to:: Private residence Living Arrangements: Spouse/significant other Available Help at Discharge: Family;Available 24 hours/day Type of Home: House Home Access: Stairs to enter [employer] of Steps: 4 Entrance Stairs-Rails: Right Home Layout: Two level;1/2 bath on main level Alternate Level Stairs-Number of Steps: 1 flight Alternate Level Stairs-Rails: Left Home Equipment: None Additional Comments: sales job  Lives With: Spouse  Functional History: Prior Function Level of Independence: Independent Functional Status:  Mobility: Bed Mobility Overal bed mobility: Needs Assistance Bed Mobility: Supine to Sit Supine to sit: Supervision Transfers Overall transfer level: Needs assistance Equipment used: 2 person hand held assist Transfers: Sit to/from [employer] Sit to Stand: Min assist;+2 physical assistance Stand pivot transfers: Mod assist;+2 physical assistance General transfer comment: Patient with poor coordination of RLE during stride to chair, inability to recognize placement of LE (over exagerated stride) moderate assist for stablity and safety.      ADL: ADL Overall ADL's : Needs assistance/impaired Eating/Feeding: Set up;Sitting (with L hand ) Grooming: Wash/dry hands;Wash/dry face;Minimal assistance;Sitting Upper Body Bathing: Minimal assitance;Sitting Lower Body Bathing: Sit to/from stand;Minimal assistance Upper Body Dressing : Minimal assistance;Sitting Lower Body Dressing: Minimal assistance;Sit to/from stand Lower Body Dressing Details (indicate cue type and reason): donned his socks independently without LOB Toilet Transfer: Moderate assistance;+2 for physical assistance;Stand-pivot (to recliner) Toilet Transfer Details (indicate cue type and reason): toward R side Functional mobility  during ADLs: Moderate assistance General ADL Comments: Balance, incoordination and inattention interfering with ADL.  Cognition: Cognition Overall Cognitive Status: Impaired/Different from baseline Arousal/Alertness: Awake/alert Orientation Level: Oriented X4 Attention: Focused;Sustained;Selective Focused Attention: Appears intact  Sustained Attention: Appears intact Selective Attention: Appears intact Memory: Impaired Memory Impairment: Storage deficit;Retrieval deficit Awareness: Appears intact Problem Solving: Impaired Problem Solving Impairment: Verbal complex Cognition Arousal/Alertness: Awake/alert Behavior During Therapy: WFL for tasks assessed/performed Overall Cognitive Status: Impaired/Different from baseline Area of Impairment: Orientation;Attention;Memory;Following commands;Safety/judgement;Awareness;Problem solving Orientation Level: Person;Place;Time;Situation (required VCs and choices to express proper responses) Current Attention Level: Alternating Following Commands: Follows one step commands consistently Safety/Judgement: Decreased awareness of safety;Decreased awareness of deficits Awareness: Anticipatory Problem Solving: Requires verbal cues;Requires tactile cues General Comments: Patient with difficulty distinguishing left/right discrimination.  Blood pressure 171/89, pulse 62, temperature 97.7 F (36.5 C), temperature source Oral, resp. rate 22, height 6' (1.829 m), weight 89.2 kg (196 lb 10.4 oz), SpO2 99.00%. Physical Exam  Constitutional: He appears well-developed.  Eyes: EOM are normal.  Neck: Normal range of motion. Neck supple. No thyromegaly present.  Cardiovascular: Normal rate and regular rhythm.   Respiratory: Effort normal and breath sounds normal. No respiratory distress.  GI: Soft. Bowel sounds are normal. He exhibits no distension.  Neurological: He is alert.  Patient makes good eye contact with examiner. He was able to state his name. He was  aphasic. Follows simple commands  Skin: Skin is warm and dry.   motor 3+/5 right deltoid, bicep, tricep, grip 4/5 right hip flexor knee extensor ankle dorsiflexor plantar flexor 5/5 in the left deltoid, bicep, tricep, grip, hip flexors, knee extensors, ankle dorsi flexion Plavix Sensation reduced to light touch in the right hand however is able to identify which fingers touched. Unable to identify light touch or localize in the right lower extremity Speech has some word finding difficulties but is able to name simple objects.  Results for orders placed during the hospital encounter of 09/22/13 (from the past 24 hour(s))  TROPONIN I     Status: Abnormal   Collection Time    09/23/13  2:55 PM      Result Value Ref Range   Troponin I 0.41 (*) <0.30 ng/mL  TROPONIN I     Status: Abnormal   Collection Time    09/23/13  8:20 PM      Result Value Ref Range   Troponin I 0.37 (*) <0.30 ng/mL  CK TOTAL AND CKMB     Status: Abnormal   Collection Time    09/23/13  8:20 PM      Result Value Ref Range   Total CK 313 (*) 7 - 232 U/L   CK, MB 2.8  0.3 - 4.0 ng/mL   Relative Index 0.9  0.0 - 2.5  TROPONIN I     Status: Abnormal   Collection Time    09/24/13  2:39 AM      Result Value Ref Range   Troponin I 0.37 (*) <0.30 ng/mL  CK TOTAL AND CKMB     Status: Abnormal   Collection Time    09/24/13  2:39 AM      Result Value Ref Range   Total CK 264 (*) 7 - 232 U/L   CK, MB 2.2  0.3 - 4.0 ng/mL   Relative Index 0.8  0.0 - 2.5   Ct Head Wo Contrast  09/23/2013   CLINICAL DATA:  Followup intracranial hemorrhage  EXAM: CT HEAD WITHOUT CONTRAST  TECHNIQUE: Contiguous axial images were obtained from the base of the skull through the vertex without intravenous contrast.  COMPARISON:  CT 09/22/2013  FINDINGS: Left thalamic high-density hematoma unchanged. This measures 16 x 29 mm. There is intraventricular extension. Blood  in the left lateral ventricle unchanged. Slight increased blood in the right  lateral ventricle and third ventricle.  5 mm midline shift to the right is unchanged.  No hydrocephalus  Chronic microvascular ischemic changes in the white matter. No acute infarct. Negative for underlying mass or edema.  IMPRESSION: Left thalamic hematoma unchanged. Intraventricular hemorrhage again noted without hydrocephalus. Mild midline shift to the right measuring 5 mm unchanged.   Electronically Signed   By: Marlan Palau M.D.   On: 09/23/2013 10:08   Ct Head (brain) Wo Contrast  09/22/2013   CLINICAL DATA:  Code stroke, RIGHT-sided weakness, LEFT facial droop  EXAM: CT HEAD WITHOUT CONTRAST  TECHNIQUE: Contiguous axial images were obtained from the base of the skull through the vertex without intravenous contrast.  COMPARISON:  None.  FINDINGS: Generalized atrophy.  Scattered small vessel chronic ischemic changes of deep cerebral white matter.  Large area of intraparenchymal hemorrhage identified, question originating from LEFT thalamus extending into white matter, measuring approximately 3.3 x 1.7 cm image 17.  Intraventricular extension of hemorrhage into LEFT lateral ventricle.  5 mm of LEFT-to-RIGHT midline shift.  No definite hydrocephalus.  Question small old white matter infarct RIGHT periventricular.  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  No additional intracranial hemorrhage or extra-axial collections.  No focal mass lesion or additional evidence of infarction.  Bones and sinuses unremarkable.  IMPRESSION: Acute intraparenchymal hemorrhage at LEFT thalamus 3.3 x 1.7 cm with evidence of intraventricular extension and mild LEFT-to-RIGHT midline shift.  Small vessel chronic ischemic changes of deep cerebral white matter.  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  Critical Value/emergent results were called by telephone at the time of interpretation on 09/22/2013 at 9:40 pm to Dr. Leroy Kennedy, who verbally acknowledged these results.   Electronically Signed   By:  Ulyses Southward M.D.   On: 09/22/2013 21:43    Assessment/Plan: Diagnosis: Left thalamic intraparenchymal hemorrhage 1. Does the need for close, 24 hr/day medical supervision in concert with the patient's rehab needs make it unreasonable for this patient to be served in a less intensive setting? Yes 2. Co-Morbidities requiring supervision/potential complications: Malignant hypertension, ethanol abuse 3. Due to bladder management, bowel management, safety, skin/wound care, disease management, medication administration and patient education, does the patient require 24 hr/day rehab nursing? Yes 4. Does the patient require coordinated care of a physician, rehab nurse, PT (1-2 hrs/day, 5 days/week), OT (1-2 hrs/day, 5 days/week) and SLP (0.5-1 hrs/day, 5 days/week) to address physical and functional deficits in the context of the above medical diagnosis(es)? Yes Addressing deficits in the following areas: balance, endurance, locomotion, strength, transferring, bowel/bladder control, bathing, dressing, feeding, grooming, toileting, cognition, speech and language 5. Can the patient actively participate in an intensive therapy program of at least 3 hrs of therapy per day at least 5 days per week? Yes 6. The potential for patient to make measurable gains while on inpatient rehab is excellent 7. Anticipated functional outcomes upon discharge from inpatient rehab are supervision  with PT, supervision with OT, supervision with SLP. 8. Estimated rehab length of stay to reach the above functional goals is: 7-10 days 9. Does the patient have adequate social supports to accommodate these discharge functional goals? Yes 10. Anticipated D/C setting: Home 11. Anticipated post D/C treatments: HH therapy and Outpatient therapy 12. Overall Rehab/Functional Prognosis: excellent  RECOMMENDATIONS: This patient's condition is appropriate for continued rehabilitative care in the following setting: CIR Patient has agreed to  participate in recommended  program. Yes Note that insurance prior authorization may be required for reimbursement for recommended care.  Comment: Must complete workup    09/24/2013

## 2013-09-24 NOTE — Progress Notes (Signed)
Rehab Admissions Coordinator Note:  Patient was screened by Kaytee Taliercio L for appropriateness for an Inpatient Acute Rehab Consult.  At this time, we are recommending Inpatient Rehab consult.  Ricki Clack L 09/24/2013, 11:37 AM  I can be reached at [phone].

## 2013-09-24 NOTE — Progress Notes (Signed)
OT Cancellation Note  Patient Details Name: CORBET HANLEY MRN: [id number] DOB: [birth year]   Cancelled Treatment:    Reason Eval/Treat Not Completed: Other (comment) Pt with active bedrest orders. Will assess when activity order updated. Thank you. [facility] Antionetta Ator, OTR/L  [phone] 09/24/2013 09/24/2013, 7:46 AM

## 2013-09-24 NOTE — Progress Notes (Signed)
STROKE TEAM PROGRESS NOTE   HISTORY Jay Wise is an 53 y.o. male with a past medical history significant for untreated HTN, smoking, alcohol use, brought in by EMS as a code stroke due to right hemiparesis and dysarthria. Wife is at the bedside and tells me that Jay Wise just got home, went to the bathroom, and when he came out was weak in the right side and with very slurred speech. She said that he was alert and did not complain of HA, vertigo, double vision, confusion, visual disturbances, CP, SOB, or palpitations. Wife said that he reluctantly allow her to call EMS who report that he was dysarthric with right hemiparesis, and SBP 210. NIHSS 10. CT brain revealed a left thalamus 3.3 x 1.7 cm intraventricular extension and mild LEFT-to-RIGHT midline shift but not hydrocephalus.  PT/INR, PTT, platelet count pending. At time of assessment, he is alert, awake, following commands, with SBP 248 requiring IV labetalol 10 mg x 2. Last known well: 09/22/13 at 815 pm. Patient was not administered TPA secondary to hemorrhage. He was admitted to the neuro ICU for further evaluation and treatment.   SUBJECTIVE (INTERVAL HISTORY) Wife at bedside. Overnight on acute issue. His BP still elevated off cardene drip now. Increased labetolol to  tid and may need 4th agent for BP control.   OBJECTIVE  Recent Labs Lab 09/22/13 2127  GLUCAP 102*    Recent Labs Lab 09/22/13 2126 09/23/13 0741  NA 137 139  K 3.7 3.9  CL 100 103  CO2 19 23  GLUCOSE 113* 113*  BUN 19 15  CREATININE 1.24 1.17  CALCIUM 8.6 8.7    Recent Labs Lab 09/22/13 2126  AST 10  ALT 17  ALKPHOS 75  BILITOT 0.4  PROT 6.2  ALBUMIN 3.6    Recent Labs Lab 09/22/13 2126  WBC 6.9  NEUTROABS 4.7  HGB 16.3  HCT 47.3  MCV 91.7  PLT 110*    Recent Labs Lab 09/23/13 0741 09/23/13 1455 09/23/13 2020 09/24/13 0239  CKTOTAL  --   --  313* 264*  CKMB  --   --  2.8 2.2  TROPONINI <0.30 0.41* 0.37* 0.37*    Recent  Labs  09/22/13 2126  LABPROT 14.6  INR 1.14   No results found for this basename: COLORURINE, APPERANCEUR, LABSPEC, PHURINE, GLUCOSEU, HGBUR, BILIRUBINUR, KETONESUR, PROTEINUR, UROBILINOGEN, NITRITE, LEUKOCYTESUR,  in the last 72 hours     Component Value Date/Time   CHOL 169 09/23/2013 0741   Lab Results  Component Value Date   HGBA1C 5.6 09/23/2013   No results found for this basename: labopia,  cocainscrnur,  labbenz,  amphetmu,  thcu,  labbarb    No results found for this basename: ETH,  in the last 168 hours  Ct Head (brain) Wo Contrast  09/23/2013 Left thalamic hematoma unchanged. Intraventricular hemorrhage again  noted without hydrocephalus. Mild midline shift to the right  measuring 5 mm unchanged.  09/22/2013   Acute intraparenchymal hemorrhage at LEFT thalamus 3.3 x 1.7 cm with evidence of intraventricular extension and mild LEFT-to-RIGHT midline shift.  Small vessel chronic ischemic changes of deep cerebral white matter.  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    2D echo - Left ventricle: The cavity size was normal. Wall thickness was increased in a pattern of severe LVH. Systolic function was normal. The estimated ejection fraction was in the range of 50% to 55%. Wall motion was normal; there were no regional wall motion abnormalities. -  Left atrium: The atrium was moderately dilated. - Right atrium: The atrium was moderately dilated.  CUS 1-39% ICA stenosis. Vertebral artery flow is antegrade  Renal Artery Duplex -  - No evidence of right renal artery stenosis. - Left renal artery not insonated.  PHYSICAL EXAM Temp:  [97.9 F (36.6 C)-99.3 F (37.4 C)] 97.9 F (36.6 C) (09/03 0800) Pulse Rate:  [57-82] 62 (09/03 0800) Resp:  [16-26] 22 (09/03 0800) BP: (131-175)/(74-106) 171/89 mmHg (09/03 0800) SpO2:  [90 %-99 %] 99 % (09/03 0800)  General - Well nourished, well developed, in no apparent distress.  Ophthalmologic - Sharp disc  margins OU.  Cardiovascular - Regular rate and rhythm with no murmur.  Mental Status -  Level of arousal and orientation to time, place, and person were intact. 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 Attention span and concentration were normal. Fund of Knowledge was assessed and was intact.  Cranial Nerves II - XII - II - Visual field intact OU. III, IV, VI - Extraocular movements intact. V - Facial sensation slightly decreased on the right. VII - mild right nasolabial fold flattening. VIII - Hearing & vestibular intact bilaterally. X - Palate elevates symmetrically. XI - Chin turning & shoulder shrug intact bilaterally. XII - Tongue protrusion intact.  Motor Strength - The patient's strength was 5-/5 RUE and pronator drift was present. 5-/5 RLE. Bulk was normal and fasciculations were absent.   Motor Tone - Muscle tone was assessed at the neck and appendages and was normal.  Reflexes - The patient's reflexes were normal in all extremities and he had no pathological reflexes.  Sensory - Light touch, temperature/pinprick were assessed and were slightly decreased on the right.    Coordination - The patient had normal movements in the hands and feet with no ataxia or dysmetria.  Tremor was absent.  Gait and Station - not tested.   ASSESSMENT/PLAN Jay Wise is a 53 y.o. male with hx of untreated HTN, smoking, alcohol use, brought in by EMS as a code stroke due to right hemiparesis and dysarthria.  He did not receive IV t-PA due to hemorrhage. 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 Admitted to the ICU. Stroke work up underway.  Stroke:  left thalamic hemorrhage with IVH, cerebral edema with left to right shift. Hemorrhage secondary to malignant hypertension      no scheduled antithrombotics, took [employer] prn  prior to admission  Carotid Doppler  unremarkable  2D Echo with EF 50-55%   Free T4 WNL  HgbA1c 5.6  SCDs for VTE prophylaxis  Cardiac thin liquids  OK to get OOB today. Therapy evals   Therapy needs:  CIR  Ongoing aggressive risk factor management  Disposition:  CIR   Malignant Hypertension   BP on arrival 242/148  Home meds:  None  Placed on po norvasc, lisinopril and labetalol. May need 4th agent  weaned off cardene drip around 12 midnight  BP 131-178/74-104 past 24h  SBP goal < 160  Unstable  Renal artery doppler showed no right renal artery stenosis but left not insonated.   Hyperlipidemia  Home meds:  None  LDL 109   Hold off statin for now due to ICH  Other Stroke Risk Factors Cigarette smoker, advised to stop smoking Smoking cessation counseling provided ETOH use Undiagnosed Obstructive sleep apnea, needs OP testing  Hospital day #  2  Marvel Plan, MD PhD Stroke Neurology 09/24/2013 9:47 PM    To contact Stroke Continuity provider, please refer to [email or web address]. After hours, contact General Neurology

## 2013-09-25 ENCOUNTER — Inpatient Hospital Stay (HOSPITAL_COMMUNITY): Payer: PRIVATE HEALTH INSURANCE

## 2013-09-25 ENCOUNTER — Encounter (HOSPITAL_COMMUNITY): Payer: Self-pay | Admitting: Physical Medicine and Rehabilitation

## 2013-09-25 ENCOUNTER — Inpatient Hospital Stay (HOSPITAL_COMMUNITY)
Admission: AD | Admit: 2013-09-25 | Discharge: 2013-10-07 | DRG: 945 | Disposition: A | Discharge status: Home / Self Care | Payer: PRIVATE HEALTH INSURANCE | Source: Intra-hospital | Attending: Physical Medicine & Rehabilitation | Admitting: Physical Medicine & Rehabilitation

## 2013-09-25 DIAGNOSIS — R488 Other symbolic dysfunctions: Secondary | ICD-10-CM | POA: Diagnosis present

## 2013-09-25 DIAGNOSIS — R4701 Aphasia: Secondary | ICD-10-CM | POA: Diagnosis present

## 2013-09-25 DIAGNOSIS — I619 Nontraumatic intracerebral hemorrhage, unspecified: Secondary | ICD-10-CM

## 2013-09-25 DIAGNOSIS — I10 Essential (primary) hypertension: Secondary | ICD-10-CM | POA: Diagnosis present

## 2013-09-25 DIAGNOSIS — R471 Dysarthria and anarthria: Secondary | ICD-10-CM | POA: Diagnosis present

## 2013-09-25 DIAGNOSIS — J449 Chronic obstructive pulmonary disease, unspecified: Secondary | ICD-10-CM | POA: Diagnosis present

## 2013-09-25 DIAGNOSIS — Z882 Allergy status to sulfonamides status: Secondary | ICD-10-CM

## 2013-09-25 DIAGNOSIS — F17200 Nicotine dependence, unspecified, uncomplicated: Secondary | ICD-10-CM | POA: Diagnosis present

## 2013-09-25 DIAGNOSIS — Z880 Allergy status to penicillin: Secondary | ICD-10-CM | POA: Diagnosis not present

## 2013-09-25 DIAGNOSIS — M25561 Pain in right knee: Secondary | ICD-10-CM

## 2013-09-25 DIAGNOSIS — I63039 Cerebral infarction due to thrombosis of unspecified carotid artery: Secondary | ICD-10-CM

## 2013-09-25 DIAGNOSIS — R29898 Other symptoms and signs involving the musculoskeletal system: Secondary | ICD-10-CM | POA: Diagnosis present

## 2013-09-25 DIAGNOSIS — E860 Dehydration: Secondary | ICD-10-CM | POA: Diagnosis not present

## 2013-09-25 DIAGNOSIS — R197 Diarrhea, unspecified: Secondary | ICD-10-CM | POA: Diagnosis not present

## 2013-09-25 DIAGNOSIS — J4489 Other specified chronic obstructive pulmonary disease: Secondary | ICD-10-CM | POA: Diagnosis present

## 2013-09-25 DIAGNOSIS — G8190 Hemiplegia, unspecified affecting unspecified side: Secondary | ICD-10-CM | POA: Diagnosis present

## 2013-09-25 DIAGNOSIS — D696 Thrombocytopenia, unspecified: Secondary | ICD-10-CM | POA: Diagnosis present

## 2013-09-25 DIAGNOSIS — I739 Peripheral vascular disease, unspecified: Secondary | ICD-10-CM | POA: Diagnosis present

## 2013-09-25 DIAGNOSIS — Z5189 Encounter for other specified aftercare: Principal | ICD-10-CM

## 2013-09-25 DIAGNOSIS — I639 Cerebral infarction, unspecified: Secondary | ICD-10-CM | POA: Diagnosis present

## 2013-09-25 DIAGNOSIS — I63139 Cerebral infarction due to embolism of unspecified carotid artery: Secondary | ICD-10-CM

## 2013-09-25 HISTORY — DX: Unspecified asthma, uncomplicated: J45.909

## 2013-09-25 MED ORDER — ACETAMINOPHEN 325 MG PO TABS
325.0000 mg | ORAL_TABLET | ORAL | Status: DC | PRN
  Administered 2013-09-26 – 2013-10-06 (×11): 650 mg via ORAL
  Filled 2013-09-25 (×3): qty 2
  Filled 2013-09-25: qty 1
  Filled 2013-09-25 (×8): qty 2

## 2013-09-25 MED ORDER — LABETALOL HCL 300 MG PO TABS
300.0000 mg | ORAL_TABLET | Freq: Three times a day (TID) | ORAL | Status: DC
  Administered 2013-09-25 – 2013-09-29 (×12): 300 mg via ORAL
  Filled 2013-09-25 (×17): qty 1

## 2013-09-25 MED ORDER — ALUM & MAG HYDROXIDE-SIMETH 200-200-20 MG/5ML PO SUSP: 30.0000 mL | ORAL | Status: DC | PRN

## 2013-09-25 MED ORDER — HYDRALAZINE HCL 50 MG PO TABS
50.0000 mg | ORAL_TABLET | Freq: Three times a day (TID) | ORAL | Status: DC
  Administered 2013-09-25 – 2013-10-07 (×34): 50 mg via ORAL
  Filled 2013-09-25 (×38): qty 1

## 2013-09-25 MED ORDER — PROCHLORPERAZINE 25 MG RE SUPP
12.5000 mg | Freq: Four times a day (QID) | RECTAL | Status: DC | PRN
  Filled 2013-09-25: qty 1

## 2013-09-25 MED ORDER — LISINOPRIL 20 MG PO TABS
20.0000 mg | ORAL_TABLET | Freq: Two times a day (BID) | ORAL | Status: DC
  Administered 2013-09-25 – 2013-10-07 (×24): 20 mg via ORAL
  Filled 2013-09-25 (×26): qty 1

## 2013-09-25 MED ORDER — GUAIFENESIN-DM 100-10 MG/5ML PO SYRP
5.0000 mL | ORAL_SOLUTION | Freq: Four times a day (QID) | ORAL | Status: DC | PRN
Start: 2013-09-25 — End: 2013-10-07

## 2013-09-25 MED ORDER — DIPHENHYDRAMINE HCL 12.5 MG/5ML PO ELIX: 12.5000 mg | ORAL_SOLUTION | Freq: Four times a day (QID) | ORAL | Status: DC | PRN

## 2013-09-25 MED ORDER — PANTOPRAZOLE SODIUM 40 MG PO TBEC
40.0000 mg | DELAYED_RELEASE_TABLET | Freq: Every day | ORAL | Status: DC
  Administered 2013-09-25 – 2013-10-06 (×12): 40 mg via ORAL
  Filled 2013-09-25 (×15): qty 1

## 2013-09-25 MED ORDER — NICOTINE 21 MG/24HR TD PT24
21.0000 mg | MEDICATED_PATCH | Freq: Every day | TRANSDERMAL | Status: DC
  Administered 2013-09-25 – 2013-09-29 (×5): 21 mg via TRANSDERMAL
  Filled 2013-09-25 (×8): qty 1

## 2013-09-25 MED ORDER — TRAZODONE HCL 50 MG PO TABS
25.0000 mg | ORAL_TABLET | Freq: Every evening | ORAL | Status: DC | PRN
  Administered 2013-09-25 – 2013-09-27 (×3): 50 mg via ORAL
  Filled 2013-09-25 (×3): qty 1

## 2013-09-25 MED ORDER — HYDRALAZINE HCL 50 MG PO TABS
50.0000 mg | ORAL_TABLET | Freq: Three times a day (TID) | ORAL | Status: DC
  Administered 2013-09-25: 50 mg via ORAL
  Filled 2013-09-25 (×3): qty 1

## 2013-09-25 MED ORDER — ALBUTEROL SULFATE (2.5 MG/3ML) 0.083% IN NEBU
2.5000 mg | INHALATION_SOLUTION | RESPIRATORY_TRACT | Status: DC | PRN
  Administered 2013-09-25: 2.5 mg via RESPIRATORY_TRACT
  Filled 2013-09-25: qty 3

## 2013-09-25 MED ORDER — AMLODIPINE BESYLATE 10 MG PO TABS
10.0000 mg | ORAL_TABLET | Freq: Every day | ORAL | Status: DC
  Administered 2013-09-26 – 2013-10-07 (×12): 10 mg via ORAL
  Filled 2013-09-25 (×13): qty 1

## 2013-09-25 MED ORDER — ENOXAPARIN SODIUM 40 MG/0.4ML ~~LOC~~ SOLN
40.0000 mg | SUBCUTANEOUS | Status: DC
  Administered 2013-09-25 – 2013-10-07 (×13): 40 mg via SUBCUTANEOUS
  Filled 2013-09-25 (×13): qty 0.4

## 2013-09-25 MED ORDER — FLEET ENEMA 7-19 GM/118ML RE ENEM: 1.0000 | ENEMA | Freq: Once | RECTAL | Status: AC | PRN

## 2013-09-25 MED ORDER — SENNOSIDES-DOCUSATE SODIUM 8.6-50 MG PO TABS
1.0000 | ORAL_TABLET | Freq: Two times a day (BID) | ORAL | Status: DC
  Administered 2013-09-25 – 2013-09-28 (×7): 1 via ORAL
  Filled 2013-09-25 (×11): qty 1

## 2013-09-25 MED ORDER — PROCHLORPERAZINE EDISYLATE 5 MG/ML IJ SOLN
5.0000 mg | Freq: Four times a day (QID) | INTRAMUSCULAR | Status: DC | PRN
  Filled 2013-09-25: qty 2

## 2013-09-25 MED ORDER — PROCHLORPERAZINE MALEATE 5 MG PO TABS
5.0000 mg | ORAL_TABLET | Freq: Four times a day (QID) | ORAL | Status: DC | PRN
  Filled 2013-09-25: qty 2

## 2013-09-25 MED ORDER — BISACODYL 10 MG RE SUPP: 10.0000 mg | Freq: Every day | RECTAL | Status: DC | PRN

## 2013-09-25 NOTE — Progress Notes (Signed)
PMR Admission Coordinator Pre-Admission Assessment  Patient: Jay Wise is an 53 y.o., male  MRN: [id number]  DOB: January 03, 1961  Height: 6' (182.9 cm)  Weight: 89.2 kg (196 lb 10.4 oz)  Insurance Information  HMO: PPO: yes PCP: IPA: 80/20: OTHER:  PRIMARY: [employer] Policy#: [id number] Subscriber: pt  CM Name: Donata Duff. Phone#: [phone] option [phone] ext [phone] Fax#: [phone]  Pre-Cert#: [id number] Employer: [employer] cert for 7 days with updates due 9/10  Benefits: Phone #: [phone] Name: 09/25/13  Eff. Date: 01/22/2009 Deduct: $1500 Out of Pocket Max: $3000 includes deductible Life Max: none  CIR: 90% SNF: 90%  Outpatient: 90% Co-Pay: no limits  Home Health: 90% Co-Pay: 100 visits combined  DME: 90% Co-Pay: 10%  Providers: in network   SECONDARY: none  Medicaid Application Date: Case Manager:  Disability Application Date: Case Worker:  Emergency Games developer Information     Name  Relation  Home  Work  Mobile     [name],[name]   [phone]  [phone]         Current Medical History  Patient Admitting Diagnosis:Left thalamic intraparenchymal hemorrhage  History of Present Illness: Jay Wise is a 53 y.o. right-handed male with history of untreated hypertension, tobacco and alcohol abuse. Patient independent prior to admission living with his wife.  Admitted 09/22/2013 with right-sided weakness and aphasia. Systolic blood pressure 210. CT of the brain in the left thalamus 3.3 x 1.7 cm intraventricular extension and mild left-to-right midline shift but no hydrocephalus. Followup cranial CT scan 09/23/2013 unchanged. Neurology services consulted for left thalamic hemorrhage felt to be secondary to malignant hypertension. Echocardiogram with ejection fraction 55% no wall motion abnormalities. Carotid Dopplers pending. Noted mild elevation in troponin 0.37 -0.41 felt to be possibly stress ischemia from hemorrhage and monitor. No chest pain or  shortness of breath. Patient is on a regular consistency diet. Blood pressure controlled with Cardene drip.  Off cardene drip 9/3 adding antiHTN meds for BP control and parameters to maintain BP Systolic less than 180.  Total: 6 NIH   Past Medical History    Past Medical History    Diagnosis  Date    .  Hypertension      Family History  family history is not on file.  Prior Rehab/Hospitalizations: none  Current Medications  Current facility-administered medications:acetaminophen (TYLENOL) suppository 650 mg, 650 mg, Rectal, Q4H PRN, Lunette Stands, MD; acetaminophen (TYLENOL) tablet 650 mg, 650 mg, Oral, Q4H PRN, Lunette Stands, MD; amLODipine (NORVASC) tablet 10 mg, 10 mg, Oral, Daily, Marvel Plan, MD, 10 mg at 09/25/13 1003; heparin injection 5,000 Units, 5,000 Units, Subcutaneous, 3 times per day, Marvel Plan, MD, 5,000 Units at 09/25/13 0630  hydrALAZINE (APRESOLINE) tablet 50 mg, 50 mg, Oral, 3 times per day, Marvel Plan, MD; labetalol (NORMODYNE) tablet 300 mg, 300 mg, Oral, TID, Marvel Plan, MD, 300 mg at 09/25/13 1003; labetalol (NORMODYNE,TRANDATE) injection 10-40 mg, 10-40 mg, Intravenous, Q10 min PRN, Lunette Stands, MD, 40 mg at 09/25/13 0242; lisinopril (PRINIVIL,ZESTRIL) tablet 20 mg, 20 mg, Oral, BID, Marvel Plan, MD, 20 mg at 09/25/13 0801  niCARdipine (CARDENE-IV) infusion (0.1 mg/ml), 3-15 mg/hr, Intravenous, Continuous, Marvel Plan, MD, 5 mg/hr at 09/25/13 0700; pantoprazole (PROTONIX) EC tablet 40 mg, 40 mg, Oral, QHS, Marvel Plan, MD, 40 mg at 09/24/13 2216; senna-docusate (Senokot-S) tablet 1 tablet, 1 tablet, Oral, BID, Lunette Stands, MD, 1 tablet at 09/24/13 0941  Patients Current Diet: Cardiac  Precautions /  Restrictions  Precautions  Precautions: Fall  Prior Activity Level  Community (5-7x/wk): works fulltime in Production designer, theatre/television/film at [employer]/Equipment: None  Home Equipment: None  Prior Functional Level   Prior Function  Level of Independence: Independent  Current Functional Level    Cognition  Arousal/Alertness: Awake/alert  Overall Cognitive Status: Impaired/Different from baseline  Current Attention Level: Alternating  Orientation Level: Oriented X4 (issues with word finding )  Following Commands: Follows one step commands consistently  Safety/Judgement: Decreased awareness of safety;Decreased awareness of deficits  General Comments: Patient with difficulty distinguishing left/right discrimination.  Attention: Focused;Sustained;Selective  Focused Attention: Appears intact  Sustained Attention: Appears intact  Selective Attention: Appears intact  Memory: Impaired  Memory Impairment: Storage deficit;Retrieval deficit  Awareness: Appears intact  Problem Solving: Impaired  Problem Solving Impairment: Verbal complex    Extremity Assessment  (includes Sensation/Coordination)      ADLs  Overall ADL's : Needs assistance/impaired  Eating/Feeding: Set up;Sitting (with L hand )  Grooming: Wash/dry hands;Wash/dry face;Minimal assistance;Sitting  Upper Body Bathing: Minimal assitance;Sitting  Lower Body Bathing: Sit to/from stand;Minimal assistance  Upper Body Dressing : Minimal assistance;Sitting  Lower Body Dressing: Minimal assistance;Sit to/from stand  Lower Body Dressing Details (indicate cue type and reason): donned his socks independently without LOB  Toilet Transfer: Moderate assistance;+2 for physical assistance;Stand-pivot (to recliner)  Toilet Transfer Details (indicate cue type and reason): toward R side  Functional mobility during ADLs: Moderate assistance  General ADL Comments: Balance, incoordination and inattention interfering with ADL.    Mobility  Overal bed mobility: Needs Assistance  Bed Mobility: Supine to Sit  Supine to sit: Supervision    Transfers  Overall transfer level: Needs assistance  Equipment used: 2 person hand held assist  Transfers: Sit to/from  [employer] Transfers  Sit to Stand: Min assist;+2 physical assistance  Stand pivot transfers: Mod assist;+2 physical assistance  General transfer comment: Patient with poor coordination of RLE during stride to chair, inability to recognize placement of LE (over exagerated stride) moderate assist for stablity and safety.    Ambulation / Gait / Stairs / Acupuncturist / Balance     Special needs/care consideration  Bowel mgmt: continent  Bladder mgmt: continent    Previous Home Environment  Living Arrangements: Spouse/significant other;Children;Other (Comment) ( 7 and 70 yo children at home)  Lives With: Spouse;Family  Available Help at Discharge: Available 24 hours/day;Other (Comment) (wife would have to FMLA , wife's parents could help with the)  Type of Home: House  Home Layout: Two level;1/2 bath on main level  Alternate Level Stairs-Rails: Left  Alternate Level Stairs-Number of Steps: 1 flight  Home Access: Stairs to enter  Entrance Stairs-Rails: Right  Entrance Stairs-Number of Steps: 4  Bathroom Shower/Tub: Pension scheme manager: Standard  Bathroom Accessibility: Yes  How Accessible: Accessible via walker  Home Care Services: No  Additional Comments: sales in parts department of [employer]  Discharge Living Setting  Plans for Discharge Living Setting: Patient's home;Lives with (comment);Other (Comment) (wife, 7 and 23 yo children)  Type of Home at Discharge: House  Discharge Home Layout: Two level;1/2 bath on main level  Alternate Level Stairs-Rails: Left  Alternate Level Stairs-Number of Steps: 1 flight  Discharge Home Access: Stairs to enter  Entrance Stairs-Rails: Right  Entrance Stairs-Number of Steps: 4  Discharge Bathroom Shower/Tub: Walk-in shower  Discharge Bathroom Toilet: Standard  Discharge Bathroom Accessibility: Yes  How Accessible: Accessible via walker  Does the patient have any problems obtaining your medications?: No   Social/Family/Support Systems  Patient Roles: Spouse;Parent;Other (Comment) (employee)  Contact Information: Bryston Colocho, wife  Anticipated Caregiver: wife and wife's parents  Anticipated Caregiver's Contact Information: see above  Ability/Limitations of Caregiver: 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, can take short term Wellsite geologist Availability: 24/7  Discharge Plan Discussed with Primary Caregiver: Yes  Is Caregiver In Agreement with Plan?: Yes  Does Caregiver/Family have Issues with Lodging/Transportation while Pt is in Rehab?: No  Goals/Additional Needs  Patient/Family Goal for Rehab: supervision for PT, OT, and SLP  Expected length of stay: [facility] 7 to 10 days  Pt/Family Agrees to Admission and willing to participate: Yes  Program Orientation Provided & Reviewed with Pt/Caregiver Including Roles & Responsibilities: Yes  Decrease burden of Care through IP rehab admission: n/a  Possible need for SNF placement upon discharge:n/a  Patient Condition: This patient's condition remains as documented in the consult dated 09/24/2013, in which the Rehabilitation Physician determined and documented that the patient's condition is appropriate for intensive rehabilitative care in an inpatient rehabilitation facility. Will admit to inpatient rehab today.  Preadmission Screen Completed By: Clois Dupes, 09/25/2013 1:39 PM  ______________________________________________________________________  Discussed status with Dr. Riley Kill on 09/25/2013 at 1339 and received telephone approval for admission today.  Admission Coordinator: Clois Dupes, time 1610 Date 09/25/2013.    Cosigned by: Ranelle Oyster, MD [09/25/2013 2:07 PM]

## 2013-09-25 NOTE — Progress Notes (Signed)
I have insurance approval for admission to inpt rehab today. I discussed with Dr. Roda Shutters and he in agreement. I will arrange for today. [phone]

## 2013-09-25 NOTE — PMR Pre-admission (Signed)
PMR Admission Coordinator Pre-Admission Assessment  Patient: Jay Wise is an 53 y.o., male MRN: [id number] DOB: [birth year] Height: 6' (182.9 cm) Weight: 89.2 kg (196 lb 10.4 oz)              Insurance Information HMO:     PPO: yes     PCP:      IPA:      80/20:      OTHER:  PRIMARY: [employer]      Policy#: [id number]      Subscriber: pt CM Name: Donata Duff.       Phone#: [phone]     Fax#: [phone] Pre-Cert#: [id number]      Employer: [employer] cert for 7 days with updates due 9/10 Benefits:  Phone #: [phone]     Name: 09/25/13 Eff. Date: 01/22/2009     Deduct: $1500       Out of Pocket Max: $3000 includes deductible      Life Max: none CIR: 90%      SNF: 90% Outpatient: 90%     Co-Pay: no limits Home Health: 90%      Co-Pay: 100 visits combined DME: 90%     Co-Pay: 10% Providers: in network  SECONDARY: none        Medicaid Application Date:       Case Manager:  Disability Application Date:       Case Worker:   Emergency Conservator, museum/gallery Information   Name Relation Home Work Mobile   Ebanks,[name]  [phone]      Current Medical History  Patient Admitting Diagnosis:Left thalamic intraparenchymal hemorrhage  History of Present Illness: Jay Wise is a 53 y.o. right-handed male with history of untreated hypertension, tobacco and alcohol abuse. Patient independent prior to admission living with his wife.   Admitted 09/22/2013 with right-sided weakness and aphasia. Systolic blood pressure 210. CT of the brain in the left thalamus 3.3 x 1.7 cm intraventricular extension and mild left-to-right midline shift but no hydrocephalus. Followup cranial CT scan 09/23/2013 unchanged. Neurology services consulted for left thalamic hemorrhage felt to be secondary to malignant hypertension. Echocardiogram with ejection fraction 55% no wall motion abnormalities. Carotid Dopplers pending. Noted mild elevation in troponin 0.37  -0.41 felt to be possibly stress ischemia from hemorrhage and monitor. No chest pain or shortness of breath. Patient is on a regular consistency diet. Blood pressure controlled with Cardene drip.  Off cardene drip 9/3 adding antiHTN meds for BP control and parameters to maintain BP Systolic less than 180.   Total: 6 NIH    Past Medical History  Past Medical History  Diagnosis Date  . Hypertension     Family History  family history is not on file.  Prior Rehab/Hospitalizations: none   Current Medications  Current facility-administered medications:acetaminophen (TYLENOL) suppository 650 mg, 650 mg, Rectal, Q4H PRN, Lunette Stands, MD;  acetaminophen (TYLENOL) tablet 650 mg, 650 mg, Oral, Q4H PRN, Lunette Stands, MD;  amLODipine (NORVASC) tablet 10 mg, 10 mg, Oral, Daily, Marvel Plan, MD, 10 mg at 09/25/13 1003;  heparin injection 5,000 Units, 5,000 Units, Subcutaneous, 3 times per day, Marvel Plan, MD, 5,000 Units at 09/25/13 9518 hydrALAZINE (APRESOLINE) tablet 50 mg, 50 mg, Oral, 3 times per day, Marvel Plan, MD;  labetalol (NORMODYNE) tablet 300 mg, 300 mg, Oral, TID, Marvel Plan, MD, 300 mg at 09/25/13 1003;  labetalol (NORMODYNE,TRANDATE) injection 10-40 mg, 10-40 mg, Intravenous, Q10 min PRN, Lunette Stands,  MD, 40 mg at 09/25/13 0242;  lisinopril (PRINIVIL,ZESTRIL) tablet 20 mg, 20 mg, Oral, BID, Marvel Plan, MD, 20 mg at 09/25/13 0801 niCARdipine (CARDENE-IV) infusion (0.1 mg/ml), 3-15 mg/hr, Intravenous, Continuous, Marvel Plan, MD, 5 mg/hr at 09/25/13 0700;  pantoprazole (PROTONIX) EC tablet 40 mg, 40 mg, Oral, QHS, Marvel Plan, MD, 40 mg at 09/24/13 2216;  senna-docusate (Senokot-S) tablet 1 tablet, 1 tablet, Oral, BID, Lunette Stands, MD, 1 tablet at 09/24/13 0941  Patients Current Diet: Cardiac  Precautions / Restrictions Precautions Precautions: Fall   Prior Activity Level Community (5-7x/wk): works fulltime in Production designer, theatre/television/film at [employer] /  Equipment Home Assistive Devices/Equipment: None Home Equipment: None  Prior Functional Level Prior Function Level of Independence: Independent  Current Functional Level Cognition  Arousal/Alertness: Awake/alert Overall Cognitive Status: Impaired/Different from baseline Current Attention Level: Alternating Orientation Level: Oriented X4 (issues with word finding ) Following Commands: Follows one step commands consistently Safety/Judgement: Decreased awareness of safety;Decreased awareness of deficits General Comments: Patient with difficulty distinguishing left/right discrimination. Attention: Focused;Sustained;Selective Focused Attention: Appears intact Sustained Attention: Appears intact Selective Attention: Appears intact Memory: Impaired Memory Impairment: Storage deficit;Retrieval deficit Awareness: Appears intact Problem Solving: Impaired Problem Solving Impairment: Verbal complex    Extremity Assessment (includes Sensation/Coordination)          ADLs  Overall ADL's : Needs assistance/impaired Eating/Feeding: Set up;Sitting (with L hand ) Grooming: Wash/dry hands;Wash/dry face;Minimal assistance;Sitting Upper Body Bathing: Minimal assitance;Sitting Lower Body Bathing: Sit to/from stand;Minimal assistance Upper Body Dressing : Minimal assistance;Sitting Lower Body Dressing: Minimal assistance;Sit to/from stand Lower Body Dressing Details (indicate cue type and reason): donned his socks independently without LOB Toilet Transfer: Moderate assistance;+2 for physical assistance;Stand-pivot (to recliner) Toilet Transfer Details (indicate cue type and reason): toward R side Functional mobility during ADLs: Moderate assistance General ADL Comments: Balance, incoordination and inattention interfering with ADL.    Mobility  Overal bed mobility: Needs Assistance Bed Mobility: Supine to Sit Supine to sit: Supervision    Transfers  Overall transfer level: Needs  assistance Equipment used: 2 person hand held assist Transfers: Sit to/from [employer] Transfers Sit to Stand: Min assist;+2 physical assistance Stand pivot transfers: Mod assist;+2 physical assistance General transfer comment: Patient with poor coordination of RLE during stride to chair, inability to recognize placement of LE (over exagerated stride) moderate assist for stablity and safety.    Ambulation / Gait / Stairs / Engineer, drilling / Balance      Special needs/care consideration Bowel mgmt: continent Bladder mgmt: continent    Previous Home Environment Living Arrangements: Spouse/significant other;Children;Other (Comment) ( 7 and 55 yo children at home)  Lives With: Spouse;Family Available Help at Discharge: Available 24 hours/day;Other (Comment) (wife would have to FMLA , wife's parents could help with the) Type of Home: House Home Layout: Two level;1/2 bath on main level Alternate Level Stairs-Rails: Left Alternate Level Stairs-Number of Steps: 1 flight Home Access: Stairs to enter Entrance Stairs-Rails: Right Entrance Stairs-Number of Steps: 4 Bathroom Shower/Tub: Health visitor: Standard Bathroom Accessibility: Yes How Accessible: Accessible via walker Home Care Services: No Additional Comments: sales in parts department of [employer]   Discharge Living Setting Plans for Discharge Living Setting: Patient's home;Lives with (comment);Other (Comment) (wife, 7 and 37 yo children) Type of Home at Discharge: House Discharge Home Layout: Two level;1/2 bath on main level Alternate Level Stairs-Rails: Left Alternate Level Stairs-Number of Steps: 1 flight Discharge Home Access: Stairs  to enter Entrance Stairs-Rails: Right Entrance Stairs-Number of Steps: 4 Discharge Bathroom Shower/Tub: Walk-in shower Discharge Bathroom Toilet: Standard Discharge Bathroom Accessibility: Yes How Accessible: Accessible via walker Does the  patient have any problems obtaining your medications?: No  Social/Family/Support Systems Patient Roles: Spouse;Parent;Other (Comment) (employee) Contact Information: Jay Wise, wife Anticipated Caregiver: wife and wife's parents Anticipated Caregiver's Contact Information: see above Ability/Limitations of Caregiver: 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, can take short term Medical laboratory scientific officer Availability: 24/7 Discharge Plan Discussed with Primary Caregiver: Yes Is Caregiver In Agreement with Plan?: Yes Does Caregiver/Family have Issues with Lodging/Transportation while Pt is in Rehab?: No  Goals/Additional Needs Patient/Family Goal for Rehab: supervision for PT, OT, and SLP Expected length of stay: ELOS 7 to 10 days Pt/Family Agrees to Admission and willing to participate: Yes Program Orientation Provided & Reviewed with Pt/Caregiver Including Roles  & Responsibilities: Yes  Decrease burden of Care through IP rehab admission: n/a  Possible need for SNF placement upon discharge:n/a  Patient Condition: This patient's condition remains as documented in the consult dated 09/24/2013, in which the Rehabilitation Physician determined and documented that the patient's condition is appropriate for intensive rehabilitative care in an inpatient rehabilitation facility. Will admit to inpatient rehab today.  Preadmission Screen Completed By:  Clois Dupes, 09/25/2013 1:39 PM ______________________________________________________________________   Discussed status with Dr. Riley Kill on 09/25/2013 at  1339 and received telephone approval for admission today.  Admission Coordinator:  Clois Dupes, time 1610 Date 09/25/2013.

## 2013-09-25 NOTE — Progress Notes (Signed)
Physical Medicine and Rehabilitation Consult Reason for Consult: Left thalamic hemorrhage Referring Physician: Dr.Xu     HPI: Jay Wise is a 53 y.o. right-handed male with history of untreated hypertension, tobacco and alcohol abuse. Patient independent prior to admission living with his wife. Admitted 09/22/2013 with right-sided weakness and aphasia. Systolic blood pressure 210. CT of the brain in the left thalamus 3.3 x 1.7 cm intraventricular extension and mild left-to-right midline shift but no hydrocephalus. Followup cranial CT scan 09/23/2013 unchanged. Neurology services consulted for left thalamic hemorrhage felt to be secondary to malignant hypertension. Echocardiogram with ejection fraction 55% no wall motion abnormalities. Carotid Dopplers pending. Noted mild elevation in troponin 0.37 -0.41 felt to be possibly stress ischemia from hemorrhage and monitor. No chest pain or shortness of breath. Patient is on a regular consistency diet. Blood pressure controlled with Cardene drip. Physical and occupational therapy evaluations completed 09/24/2013 with recommendations of physical medicine rehabilitation consult.   Patient just finished carotid Dopplers. Eating a hamburger without any evidence of coughing Review of Systems  Neurological: Positive for headaches.  All other systems reviewed and are negative. Past Medical History   Diagnosis  Date   .  Hypertension      History reviewed. No pertinent past surgical history. No family history on file. Social History: reports that he has been smoking.  He does not have any smokeless tobacco history on file. He reports that he drinks alcohol. He reports that he does not use illicit drugs. Allergies:   Allergies   Allergen  Reactions   .  Penicillins  Other (See Comments)       unknown   .  Sulfa Antibiotics  Other (See Comments)       unknown    Medications Prior to Admission   Medication  Sig  Dispense  Refill   .   Aspirin-Salicylamide-Caffeine (BC HEADACHE POWDER PO)  Take 1 packet by mouth 2 (two) times daily as needed. pain            Home: Home Living Family/patient expects to be discharged to:: Private residence Living Arrangements: Spouse/significant other Available Help at Discharge: Family;Available 24 hours/day Type of Home: House Home Access: Stairs to enter [employer] of Steps: 4 Entrance Stairs-Rails: Right Home Layout: Two level;1/2 bath on main level Alternate Level Stairs-Number of Steps: 1 flight Alternate Level Stairs-Rails: Left Home Equipment: None Additional Comments: sales job  Lives With: Spouse   Functional History: Prior Function Level of Independence: Independent Functional Status:   Mobility: Bed Mobility Overal bed mobility: Needs Assistance Bed Mobility: Supine to Sit Supine to sit: Supervision Transfers Overall transfer level: Needs assistance Equipment used: 2 person hand held assist Transfers: Sit to/from [employer] Sit to Stand: Min assist;+2 physical assistance Stand pivot transfers: Mod assist;+2 physical assistance General transfer comment: Patient with poor coordination of RLE during stride to chair, inability to recognize placement of LE (over exagerated stride) moderate assist for stablity and safety.   ADL: ADL Overall ADL's : Needs assistance/impaired Eating/Feeding: Set up;Sitting (with L hand ) Grooming: Wash/dry hands;Wash/dry face;Minimal assistance;Sitting Upper Body Bathing: Minimal assitance;Sitting Lower Body Bathing: Sit to/from stand;Minimal assistance Upper Body Dressing : Minimal assistance;Sitting Lower Body Dressing: Minimal assistance;Sit to/from stand Lower Body Dressing Details (indicate cue type and reason): donned his socks independently without LOB Toilet Transfer: Moderate assistance;+2 for physical assistance;Stand-pivot (to recliner) Toilet Transfer Details (indicate cue type and reason):  toward R side Functional mobility during ADLs: Moderate assistance General ADL  Comments: Balance, incoordination and inattention interfering with ADL.   Cognition: Cognition Overall Cognitive Status: Impaired/Different from baseline Arousal/Alertness: Awake/alert Orientation Level: Oriented X4 Attention: Focused;Sustained;Selective Focused Attention: Appears intact Sustained Attention: Appears intact Selective Attention: Appears intact Memory: Impaired Memory Impairment: Storage deficit;Retrieval deficit Awareness: Appears intact Problem Solving: Impaired Problem Solving Impairment: Verbal complex Cognition Arousal/Alertness: Awake/alert Behavior During Therapy: WFL for tasks assessed/performed Overall Cognitive Status: Impaired/Different from baseline Area of Impairment: Orientation;Attention;Memory;Following commands;Safety/judgement;Awareness;Problem solving Orientation Level: Person;Place;Time;Situation (required VCs and choices to express proper responses) Current Attention Level: Alternating Following Commands: Follows one step commands consistently Safety/Judgement: Decreased awareness of safety;Decreased awareness of deficits Awareness: Anticipatory Problem Solving: Requires verbal cues;Requires tactile cues General Comments: Patient with difficulty distinguishing left/right discrimination.   Blood pressure 171/89, pulse 62, temperature 97.7 F (36.5 C), temperature source Oral, resp. rate 22, height 6' (1.829 m), weight 89.2 kg (196 lb 10.4 oz), SpO2 99.00%. Physical Exam  Constitutional: He appears well-developed.  Eyes: EOM are normal.  Neck: Normal range of motion. Neck supple. No thyromegaly present.  Cardiovascular: Normal rate and regular rhythm.   Respiratory: Effort normal and breath sounds normal. No respiratory distress.  GI: Soft. Bowel sounds are normal. He exhibits no distension.  Neurological: He is alert.  Patient makes good eye contact with  examiner. He was able to state his name. He was aphasic. Follows simple commands  Skin: Skin is warm and dry.  motor 3+/5 right deltoid, bicep, tricep, grip 4/5 right hip flexor knee extensor ankle dorsiflexor plantar flexor 5/5 in the left deltoid, bicep, tricep, grip, hip flexors, knee extensors, ankle dorsi flexion Plavix Sensation reduced to light touch in the right hand however is able to identify which fingers touched. Unable to identify light touch or localize in the right lower extremity Speech has some word finding difficulties but is able to name simple objects.    Results for orders placed during the hospital encounter of 09/22/13 (from the past 24 hour(s))   TROPONIN I     Status: Abnormal     Collection Time      09/23/13  2:55 PM       Result  Value  Ref Range     Troponin I  0.41 (*)  <0.30 ng/mL   TROPONIN I     Status: Abnormal     Collection Time      09/23/13  8:20 PM       Result  Value  Ref Range     Troponin I  0.37 (*)  <0.30 ng/mL   CK TOTAL AND CKMB     Status: Abnormal     Collection Time      09/23/13  8:20 PM       Result  Value  Ref Range     Total CK  313 (*)  7 - 232 U/L     CK, MB  2.8   0.3 - 4.0 ng/mL     Relative Index  0.9   0.0 - 2.5   TROPONIN I     Status: Abnormal     Collection Time      09/24/13  2:39 AM       Result  Value  Ref Range     Troponin I  0.37 (*)  <0.30 ng/mL   CK TOTAL AND CKMB     Status: Abnormal     Collection Time      09/24/13  2:39 AM       Result  Value  Ref Range     Total CK  264 (*)  7 - 232 U/L     CK, MB  2.2   0.3 - 4.0 ng/mL     Relative Index  0.8   0.0 - 2.5    Ct Head Wo Contrast   09/23/2013   CLINICAL DATA:  Followup intracranial hemorrhage  EXAM: CT HEAD WITHOUT CONTRAST  TECHNIQUE: Contiguous axial images were obtained from the base of the skull through the vertex without intravenous contrast.  COMPARISON:  CT 09/22/2013  FINDINGS: Left thalamic high-density hematoma unchanged. This measures 16 x 29  mm. There is intraventricular extension. Blood in the left lateral ventricle unchanged. Slight increased blood in the right lateral ventricle and third ventricle.  5 mm midline shift to the right is unchanged.  No hydrocephalus  Chronic microvascular ischemic changes in the white matter. No acute infarct. Negative for underlying mass or edema.  IMPRESSION: Left thalamic hematoma unchanged. Intraventricular hemorrhage again noted without hydrocephalus. Mild midline shift to the right measuring 5 mm unchanged.   Electronically Signed   By: Marlan Palau M.D.   On: 09/23/2013 10:08    Ct Head (brain) Wo Contrast   09/22/2013   CLINICAL DATA:  Code stroke, RIGHT-sided weakness, LEFT facial droop  EXAM: CT HEAD WITHOUT CONTRAST  TECHNIQUE: Contiguous axial images were obtained from the base of the skull through the vertex without intravenous contrast.  COMPARISON:  None.  FINDINGS: Generalized atrophy.  Scattered small vessel chronic ischemic changes of deep cerebral white matter.  Large area of intraparenchymal hemorrhage identified, question originating from LEFT thalamus extending into white matter, measuring approximately 3.3 x 1.7 cm image 17.  Intraventricular extension of hemorrhage into LEFT lateral ventricle.  5 mm of LEFT-to-RIGHT midline shift.  No definite hydrocephalus.  Question small old white matter infarct RIGHT periventricular.  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  No additional intracranial hemorrhage or extra-axial collections.  No focal mass lesion or additional evidence of infarction.  Bones and sinuses unremarkable.  IMPRESSION: Acute intraparenchymal hemorrhage at LEFT thalamus 3.3 x 1.7 cm with evidence of intraventricular extension and mild LEFT-to-RIGHT midline shift.  Small vessel chronic ischemic changes of deep cerebral white matter.  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  Critical Value/emergent results were called by telephone at the time of  interpretation on 09/22/2013 at 9:40 pm to Dr. Leroy Kennedy, who verbally acknowledged these results.   Electronically Signed   By: Ulyses Southward M.D.   On: 09/22/2013 21:43     Assessment/Plan: Diagnosis: Left thalamic intraparenchymal hemorrhage Does the need for close, 24 hr/day medical supervision in concert with the patient's rehab needs make it unreasonable for this patient to be served in a less intensive setting? Yes Co-Morbidities requiring supervision/potential complications: Malignant hypertension, ethanol abuse Due to bladder management, bowel management, safety, skin/wound care, disease management, medication administration and patient education, does the patient require 24 hr/day rehab nursing? Yes Does the patient require coordinated care of a physician, rehab nurse, PT (1-2 hrs/day, 5 days/week), OT (1-2 hrs/day, 5 days/week) and SLP (0.5-1 hrs/day, 5 days/week) to address physical and functional deficits in the context of the above medical diagnosis(es)? Yes Addressing deficits in the following areas: balance, endurance, locomotion, strength, transferring, bowel/bladder control, bathing, dressing, feeding, grooming, toileting, cognition, speech and language Can the patient actively participate in an intensive therapy program of at least 3 hrs of therapy per day at least 5 days per week? Yes The potential  for patient to make measurable gains while on inpatient rehab is excellent Anticipated functional outcomes upon discharge from inpatient rehab are supervision with PT, supervision with OT, supervision with SLP. Estimated rehab length of stay to reach the above functional goals is: 7-10 days Does the patient have adequate social supports to accommodate these discharge functional goals? Yes Anticipated D/C setting: Home Anticipated post D/C treatments: HH therapy and Outpatient therapy Overall Rehab/Functional Prognosis: excellent   RECOMMENDATIONS: This patient's condition is appropriate  for continued rehabilitative care in the following setting: CIR Patient has agreed to participate in recommended program. Yes Note that insurance prior authorization may be required for reimbursement for recommended care.   Comment: Must complete workup       09/24/2013    Revision History...     Date/Time User Action   09/24/2013 3:22 PM Erick Colace, MD Sign   09/24/2013 1:03 PM Charlton Amor, PA-C Pend  View Details Report   Routing History...     Date/Time From To Method   09/24/2013 3:22 PM Erick Colace, MD Erick Colace, MD In Basket   09/24/2013 3:22 PM Erick Colace, MD No Pcp Per Patient In Basket

## 2013-09-25 NOTE — Progress Notes (Signed)
I met with pt and his wife at bedside. We discussed an inpt rehab admission pending insurance approval for possible tomorrow. I have discussed with Dr. Erlinda Hong and he is in agreement. Pt and wife in agreement. [phone]

## 2013-09-25 NOTE — Progress Notes (Signed)
Physical Therapy Treatment Patient Details Name: Jay Wise MRN: [id number] DOB: [birth year] Today's Date: 09/25/2013    History of Present Illness Mr. HAIM HANSSON is a 53 y.o. male with hx of untreated HTN, smoking, alcohol use, brought in by EMS as a code stroke due to right hemiparesis and dysarthria.  He did not receive IV t-PA due to hemorrhage. 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    PT Comments    Patient tolerated ambulation with physical assist today. Patient with significnat deficits during ambulation related to RLE weakness and ataxia. Will continue to see and progress as tolerated.  Follow Up Recommendations  CIR     Equipment Recommendations  Other (comment) (TBD)    Recommendations for Other Services Rehab consult     Precautions / Restrictions Precautions Precautions: Fall    Mobility  Bed Mobility Overal bed mobility: Needs Assistance Bed Mobility: Supine to Sit     Supine to sit: Supervision        Transfers Overall transfer level: Needs assistance Equipment used: Rolling walker (2 wheeled) Transfers: Sit to/from Stand Sit to Stand: Min assist         General transfer comment: Assist for stability  Ambulation/Gait Ambulation/Gait assistance: Mod assist Ambulation Distance (Feet): 46 Feet (23 x 2 one seated rest break) Assistive device: Rolling walker (2 wheeled) Gait Pattern/deviations: Step-through pattern;Decreased stride length;Steppage;Ataxic;Staggering left;Staggering right;Narrow base of support Gait velocity: decreased Gait velocity interpretation: Below normal speed for age/gender General Gait Details: Patient with significant instability during gait, Max VCs for stride and placement of RLE, patient with decreased R knee flexion and fott clearance secondary to weakness. Manual assist and vcs for hand placement on Rw RUE.   Stairs            Wheelchair Mobility    Modified  Rankin (Stroke Patients Only)       Balance                                    Cognition Arousal/Alertness: Awake/alert Behavior During Therapy: WFL for tasks assessed/performed Overall Cognitive Status: Impaired/Different from baseline           Safety/Judgement: Decreased awareness of safety;Decreased awareness of deficits Awareness: Anticipatory Problem Solving: Requires verbal cues;Requires tactile cues General Comments: Patient with difficulty distinguishing left/right discrimination.    Exercises      General Comments        Pertinent Vitals/Pain Pain Assessment: No/denies pain    Home Living   Living Arrangements: Spouse/significant other;Children;Other (Comment) ( 7 and 79 yo children at home) Available Help at Discharge: Available 24 hours/day;Other (Comment) (wife would have to [facility] , wife's parents could help with the)           Additional Comments: sales in parts department of [employer]     Prior Function            PT Goals (current goals can now be found in the care plan section) Acute Rehab PT Goals Patient Stated Goal: to do whatever it takes PT Goal Formulation: With patient Time For Goal Achievement: 10/08/13 Potential to Achieve Goals: Good Progress towards PT goals: Progressing toward goals    Frequency  Min 3X/week    PT Plan Current plan remains appropriate    Co-evaluation             End of Session Equipment Utilized  During Treatment: Gait belt Activity Tolerance: Patient tolerated treatment well;Treatment limited secondary to medical complications (Comment) (Elevated BP beyond parameters: 169 supine, 175 EOB systolic ) Patient left: in chair;with call bell/phone within reach;with family/visitor present     Time: 1610-9604 PT Time Calculation (min): 19 min  Charges:  $Gait Training: 8-22 mins                    G CodesFabio Asa 2013/10/06, 2:37 PM Charlotte Crumb, PT DPT   [phone]

## 2013-09-25 NOTE — Discharge Summary (Signed)
Stroke Discharge Summary  Patient ID: Jay Wise   MRN: [id number]      DOB: 01-04-1961  Date of Admission: 09/22/2013 Date of Discharge: 09/25/2013  Attending Physician:  Marvel Plan, MD, Stroke MD  Consulting Physician(s):  none  Patient's PCP:  No PCP Per Patient  Discharge Diagnoses:  Active Problems:   Stroke due to intracerebral hemorrhage BMI  Body mass index is 26.66 kg/(m^2).  Past Medical History  Diagnosis Date  . Hypertension    History reviewed. No pertinent past surgical history.  Medications to be continued on Rehab . amLODipine  10 mg Oral Daily  . heparin subcutaneous  5,000 Units Subcutaneous 3 times per day  . hydrALAZINE  50 mg Oral 3 times per day  . labetalol  300 mg Oral TID  . lisinopril  20 mg Oral BID  . pantoprazole  40 mg Oral QHS  . senna-docusate  1 tablet Oral BID    LABORATORY STUDIES CBC    Component Value Date/Time   WBC 6.9 09/22/2013 2126   RBC 5.16 09/22/2013 2126   HGB 16.3 09/22/2013 2126   HCT 47.3 09/22/2013 2126   PLT 110* 09/22/2013 2126   MCV 91.7 09/22/2013 2126   MCH 31.6 09/22/2013 2126   MCHC 34.5 09/22/2013 2126   RDW 13.9 09/22/2013 2126   LYMPHSABS 1.6 09/22/2013 2126   MONOABS 0.5 09/22/2013 2126   EOSABS 0.1 09/22/2013 2126   BASOSABS 0.0 09/22/2013 2126   CMP    Component Value Date/Time   NA 139 09/23/2013 0741   K 3.9 09/23/2013 0741   CL 103 09/23/2013 0741   CO2 23 09/23/2013 0741   GLUCOSE 113* 09/23/2013 0741   BUN 15 09/23/2013 0741   CREATININE 1.17 09/23/2013 0741   CALCIUM 8.7 09/23/2013 0741   PROT 6.2 09/22/2013 2126   ALBUMIN 3.6 09/22/2013 2126   AST 10 09/22/2013 2126   ALT 17 09/22/2013 2126   ALKPHOS 75 09/22/2013 2126   BILITOT 0.4 09/22/2013 2126   GFRNONAA 69* 09/23/2013 0741   GFRAA 79* 09/23/2013 0741   COAGS Lab Results  Component Value Date   INR 1.14 09/22/2013   Lipid Panel    Component Value Date/Time   CHOL 169 09/23/2013 0741   TRIG 78 09/23/2013 0741   HDL 44 09/23/2013 0741   CHOLHDL 3.8 09/23/2013 0741   VLDL 16  09/23/2013 0741   LDLCALC 109* 09/23/2013 0741   HgbA1C  Lab Results  Component Value Date   HGBA1C 5.6 09/23/2013   Cardiac Panel (last 3 results)  Recent Labs  09/24/13 0239 09/24/13 1205 09/24/13 1439  CKTOTAL 264* 223 188  CKMB 2.2 2.0 2.1  TROPONINI 0.37* <0.30 0.30*  RELINDX 0.8 0.9 1.1   Urinalysis No results found for this basename: colorurine, appearanceur, labspec, phurine, glucoseu, hgbur, bilirubinur, ketonesur, proteinur, urobilinogen, nitrite, leukocytesur   Urine Drug Screen  No results found for this basename: labopia, cocainscrnur, labbenz, amphetmu, thcu, labbarb    Alcohol Level No results found for this basename: eth    SIGNIFICANT DIAGNOSTIC STUDIES  Ct Head (brain) Wo Contrast  09/23/2013 Left thalamic hematoma unchanged. Intraventricular hemorrhage again  noted without hydrocephalus. Mild midline shift to the right  measuring 5 mm unchanged.  09/22/2013 Acute intraparenchymal hemorrhage at LEFT thalamus 3.3 x 1.7 cm with evidence of intraventricular extension and mild LEFT-to-RIGHT midline shift. Small vessel chronic ischemic changes of deep cerebral white matter. Probable old RIGHT periventricular and LEFT caudate lacunar  infarcts.  2D echo  - Left ventricle: The cavity size was normal. Wall thickness was increased in a pattern of severe LVH. Systolic function was normal. The estimated ejection fraction was in the range of 50% to 55%. Wall motion was normal; there were no regional wall motion abnormalities. - Left atrium: The atrium was moderately dilated. - Right atrium: The atrium was moderately dilated.  CUS 1-39% ICA stenosis. Vertebral artery flow is antegrade  Renal Artery Duplex -  - No evidence of right renal artery stenosis. - Left renal artery not insonated.   HISTORY OF PRESENT ILLNES Jay Wise is an 53 y.o. male with a past medical history significant for untreated HTN, smoking, alcohol use, brought in by EMS as a code stroke due to right  hemiparesis and dysarthria. Wife is at the bedside and tells me that Jay Wise just got home, went to the bathroom, and when he came out was weak in the right side and with very slurred speech. She said that he was alert and did not complain of HA, vertigo, double vision, confusion, visual disturbances, CP, SOB, or palpitations. Wife said that he reluctantly allow her to call EMS who report that he was dysarthric with right hemiparesis, and SBP 210. NIHSS 10. CT brain revealed a left thalamus 3.3 x 1.7 cm intraventricular extension and mild LEFT-to-RIGHT midline shift but not hydrocephalus.  PT/INR, PTT, platelet count pending. At time of assessment, he is alert, awake, following commands, with SBP 248 requiring IV labetalol 10 mg x 2. Last known well: 09/22/13 at 815 pm. Patient was not administered TPA secondary to hemorrhage. He was admitted to the neuro ICU for further evaluation and treatment.  HOSPITAL COURSE After admission, repeat Imaging showed stable left thalamic hemorrhage. He passed swallow and put on po medications for high BP. He needed 4 different anti-HTN meds to control his BP around 160s range. He is eventually off cardene drip. His HTN work up so far negative. And PT/OT recommend CIR for further rehab.    Stroke: left thalamic hemorrhage with IVH, Hemorrhage secondary to malignant hypertension  no scheduled antithrombotics, took [employer] prn prior to admission  Carotid Doppler unremarkable  2D Echo with EF 50-55%  Free T4 WNL  HgbA1c 5.6  SCDs and heparin subq for VTE prophylaxis  Cardiac thin liquids   Therapy needs: CIR Ongoing aggressive risk factor management Disposition: CIR   Malignant Hypertension  BP on arrival 242/148  Home meds: None  Placed on po norvasc, lisinopril and labetalol and also hydralazine  weaned off cardene drip   SBP goal < 180  stable for now Renal artery doppler showed no right renal artery stenosis but left not insonated.  Renin,  aldosterone and metanephrine level pending, will review as outpt.  Hyperlipidemia  Home meds: None  LDL 109  Hold off statin for now due to [facility] May consider to add lipitor once ICH resolved.  Other Stroke Risk Factors  Cigarette smoker, advised to stop smoking  Smoking cessation counseling provided  ETOH use  Undiagnosed Obstructive sleep apnea, needs OP testing after discharge   DISCHARGE EXAM Blood pressure 147/91, pulse 54, temperature 98.1 F (36.7 C), temperature source Oral, resp. rate 15, height 6' (1.829 m), weight 196 lb 10.4 oz (89.2 kg), SpO2 96.00%.   General - Well nourished, well developed, in no apparent distress.  Ophthalmologic - Sharp disc margins OU.  Cardiovascular - Regular rate and rhythm with no murmur.  Mental Status -  Level  of arousal and orientation to time, place, and person were intact.  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  Attention span and concentration were normal.  Fund of Knowledge was assessed and was intact.  Cranial Nerves II - XII -  II - Visual field intact OU.  III, IV, VI - Extraocular movements intact.  V - Facial sensation slightly decreased on the right.  VII - mild right nasolabial fold flattening.  VIII - Hearing & vestibular intact bilaterally.  X - Palate elevates symmetrically.  XI - Chin turning & shoulder shrug intact bilaterally.  XII - Tongue protrusion intact.  Motor Strength - The patient's strength was 5-/5 RUE and pronator drift was present. 5-/5 RLE. Bulk was normal and fasciculations were absent.  Motor Tone - Muscle tone was assessed at the neck and appendages and was normal.  Reflexes - The patient's reflexes were normal in all extremities and he had no pathological reflexes.  Sensory - Light touch, temperature/pinprick were assessed and were slightly decreased on the right.  Coordination - The  patient had normal movements in the hands and feet with no ataxia or dysmetria. Tremor was absent.  Gait and Station - not tested.  Discharge Diet  Cardiac thin liquids  DISCHARGE PLAN  Disposition:  Transfer to [facility] for ongoing PT, OT and ST  no antiplatelet at this time for secondary stroke prevention.  Recommend ongoing risk factor control by Primary Care Physician at time of discharge from inpatient rehabilitation. Risk factor recommendations:  Hypertension target range 130-140/70-80 Lipid range - LDL < 100 and checked every 6 months, fasting Diabetes - HgB A1C <7 Smoking cessation   Follow-up PCP in 2 weeks following discharge from rehab.  Follow-up with Dr. Roda Shutters, Stroke Clinic in 2 months.  35 minutes were spent preparing discharge.  Signed  Marvel Plan, MD PhD Stroke Neurology 09/25/2013 3:33 PM

## 2013-09-25 NOTE — H&P (View-Only) (Signed)
Physical Medicine and Rehabilitation Admission H&P    Chief Complaint  Patient presents with  .  Right sided weakness and difficulty speaking.    HPI:   Jay Wise is a 53 y.o. right handed male with history of untreated hypertension, tobacco abuse; who was admitted 09/22/2013 with right-sided weakness and aphasia. Systolic blood pressure 210 at admisison. CT of the brain in the left thalamus 3.3 x 1.7 cm intraventricular extension and mild left-to-right midline shift but no hydrocephalus. Followup cranial CT scan 09/23/2013 unchanged. Dr. 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 Echocardiogram with ejection fraction 55% no wall motion abnormalities. Carotid dopplers without ICA stenosis. Renal artery duplex without RAS but L-RA not insonated. Mild elevation in troponin 0.37 -0.41 felt to be possibly stress ischemia from hemorrhage and no work up as without CP,EKG changes or shortness of breath.  Patient with moderate expressive aphasia with difficulty storing new information. He continues to have resultant right sided weakness, cognitive and perceptual deficits as well as dysarthria. CIR recommended by MD and Rehab team.   Review of Systems  HENT: Negative for hearing loss.   Eyes: Negative for blurred vision and double vision.  Respiratory: Positive for wheezing. Negative for cough and shortness of breath.   Cardiovascular: Negative for chest pain and palpitations.  Gastrointestinal: Negative for heartburn, nausea and constipation.  Genitourinary: Negative for urgency and frequency.  Musculoskeletal: Negative for back pain, myalgias and neck pain.  Neurological: Positive for speech change and focal weakness. Negative for headaches.  Psychiatric/Behavioral: The patient is nervous/anxious.     Past Medical History  Diagnosis Date  . Hypertension    History reviewed. No pertinent past surgical history.  Family History  Problem  Relation Age of Onset  . Cancer Father     Social History:  Married. 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. Independent PTA. He reports that he has been smoking 2 PPD.  He does not have any smokeless tobacco history on file. He reports that he drinks does not drink alcohol--quit few years ago.  He reports that he does not use illicit drugs.    Allergies  Allergen Reactions  . Penicillins Other (See Comments)    unknown  . Sulfa Antibiotics Other (See Comments)    unknown   Medications Prior to Admission  Medication Sig Dispense Refill  . Aspirin-Salicylamide-Caffeine (BC HEADACHE POWDER PO) Take 1 packet by mouth 2 (two) times daily as needed. pain        Home: Home Living Family/patient expects to be discharged to:: Private residence Living Arrangements: Spouse/significant other;Children;Other (Comment) ( 7 and 67 yo children at home) Available Help at Discharge: Available 24 hours/day;Other (Comment) (wife would have to FMLA , wife's parents could help with the) Type of Home: House Home Access: Stairs to enter [employer] of Steps: 4 Entrance Stairs-Rails: Right Home Layout: Two level;1/2 bath on main level Alternate Level Stairs-Number of Steps: 1 flight Alternate Level Stairs-Rails: Left Home Equipment: None Additional Comments: sales in parts department of [employer]   Lives With: Spouse;Family   Functional History: Prior Function Level of Independence: Independent  Functional Status:  Mobility: Bed Mobility Overal bed mobility: Needs Assistance Bed Mobility: Supine to Sit Supine to sit: Supervision Transfers Overall transfer level: Needs assistance Equipment used: Rolling walker (2 wheeled) Transfers: Sit to/from Stand Sit to Stand: Min assist Stand pivot transfers: Mod assist;+2 physical assistance General transfer comment: Assist for stability Ambulation/Gait Ambulation/Gait assistance: Mod assist  Ambulation Distance (Feet): 46 Feet (23 x 2 one seated  rest break) Assistive device: Rolling walker (2 wheeled) Gait Pattern/deviations: Step-through pattern;Decreased stride length;Steppage;Ataxic;Staggering left;Staggering right;Narrow base of support Gait velocity: decreased Gait velocity interpretation: Below normal speed for age/gender General Gait Details: Patient with significant instability during gait, Max VCs for stride and placement of RLE, patient with decreased R knee flexion and fott clearance secondary to weakness. Manual assist and vcs for hand placement on Rw RUE.    ADL: ADL Overall ADL's : Needs assistance/impaired Eating/Feeding: Set up;Sitting (with L hand ) Grooming: Wash/dry hands;Wash/dry face;Minimal assistance;Sitting Upper Body Bathing: Minimal assitance;Sitting Lower Body Bathing: Sit to/from stand;Minimal assistance Upper Body Dressing : Minimal assistance;Sitting Lower Body Dressing: Minimal assistance;Sit to/from stand Lower Body Dressing Details (indicate cue type and reason): donned his socks independently without LOB Toilet Transfer: Moderate assistance;+2 for physical assistance;Stand-pivot (to recliner) Toilet Transfer Details (indicate cue type and reason): toward R side Functional mobility during ADLs: Moderate assistance General ADL Comments: Balance, incoordination and inattention interfering with ADL.  Cognition: Cognition Overall Cognitive Status: Impaired/Different from baseline Arousal/Alertness: Awake/alert Orientation Level: Oriented X4 (issues with word finding ) Attention: Focused;Sustained;Selective Focused Attention: Appears intact Sustained Attention: Appears intact Selective Attention: Appears intact Memory: Impaired Memory Impairment: Storage deficit;Retrieval deficit Awareness: Appears intact Problem Solving: Impaired Problem Solving Impairment: Verbal complex Cognition Arousal/Alertness: Awake/alert Behavior During Therapy: WFL for tasks assessed/performed Overall Cognitive  Status: Impaired/Different from baseline Area of Impairment: Orientation;Attention;Memory;Following commands;Safety/judgement;Awareness;Problem solving Orientation Level: Person;Place;Time;Situation (required VCs and choices to express proper responses) Current Attention Level: Alternating Following Commands: Follows one step commands consistently Safety/Judgement: Decreased awareness of safety;Decreased awareness of deficits Awareness: Anticipatory Problem Solving: Requires verbal cues;Requires tactile cues General Comments: Patient with difficulty distinguishing left/right discrimination.  Physical Exam: Blood pressure 147/91, pulse 54, temperature 98.1 F (36.7 C), temperature source Oral, resp. rate 15, height 6' (1.829 m), weight 89.2 kg (196 lb 10.4 oz), SpO2 96.00%. Physical Exam  Constitutional: He appears well-developed.  Mouth: dentition fair, oral mucosa pink/moist Eyes: EOM are normal.  Neck: Normal range of motion. Neck supple. No thyromegaly present.  Cardiovascular: Normal rate and regular rhythm. No murmur or rubs Respiratory: Diffuse wheezing noted and generally diminished air movement. No respiratory distress.  GI: Soft. Bowel sounds are normal. He exhibits no distension. Non-tender Neurological: He is alert.  Patient makes good eye contact with examiner. He was able to state his name. He was aphasic. Follows simple commands. Verbal apraxia/expressive language deficits. Able to give me the date with extra time. Could identify 2 simple objects but not stethescope. RUE: 4- shoulder, deltoid, bicept, tricep, grip, +PD. RLE: 4/5 HF, KE, ADF/APF---some apraxia with motor efforts. Sensation diminished slightly to LT and PP. DTR's 1+.  Psych: pleasant and cooperative.   Skin: Skin is warm and dry. A few scattered abrasions    Results for orders placed during the hospital encounter of 09/22/13 (from the past 48 hour(s))  TROPONIN I     Status: Abnormal   Collection Time     09/23/13  8:20 PM      Result Value Ref Range   Troponin I 0.37 (*) <0.30 ng/mL   Comment:            Due to the release kinetics of cTnI,     a negative result within the first hours     of the onset of symptoms does not rule out     myocardial infarction with certainty.     If myocardial  infarction is still suspected,     repeat the test at appropriate intervals.     CRITICAL VALUE NOTED.  VALUE IS CONSISTENT WITH PREVIOUSLY REPORTED AND CALLED VALUE.  CK TOTAL AND CKMB     Status: Abnormal   Collection Time    09/23/13  8:20 PM      Result Value Ref Range   Total CK 313 (*) 7 - 232 U/L   CK, MB 2.8  0.3 - 4.0 ng/mL   Relative Index 0.9  0.0 - 2.5  TROPONIN I     Status: Abnormal   Collection Time    09/24/13  2:39 AM      Result Value Ref Range   Troponin I 0.37 (*) <0.30 ng/mL   Comment:            Due to the release kinetics of cTnI,     a negative result within the first hours     of the onset of symptoms does not rule out     myocardial infarction with certainty.     If myocardial infarction is still suspected,     repeat the test at appropriate intervals.     CRITICAL VALUE NOTED.  VALUE IS CONSISTENT WITH PREVIOUSLY REPORTED AND CALLED VALUE.  CK TOTAL AND CKMB     Status: Abnormal   Collection Time    09/24/13  2:39 AM      Result Value Ref Range   Total CK 264 (*) 7 - 232 U/L   CK, MB 2.2  0.3 - 4.0 ng/mL   Relative Index 0.8  0.0 - 2.5  TROPONIN I     Status: None   Collection Time    09/24/13 12:05 PM      Result Value Ref Range   Troponin I <0.30  <0.30 ng/mL   Comment:            Due to the release kinetics of cTnI,     a negative result within the first hours     of the onset of symptoms does not rule out     myocardial infarction with certainty.     If myocardial infarction is still suspected,     repeat the test at appropriate intervals.  CK TOTAL AND CKMB     Status: None   Collection Time    09/24/13 12:05 PM      Result Value Ref Range    Total CK 223  7 - 232 U/L   CK, MB 2.0  0.3 - 4.0 ng/mL   Relative Index 0.9  0.0 - 2.5  TROPONIN I     Status: Abnormal   Collection Time    09/24/13  2:39 PM      Result Value Ref Range   Troponin I 0.30 (*) <0.30 ng/mL   Comment:            Due to the release kinetics of cTnI,     a negative result within the first hours     of the onset of symptoms does not rule out     myocardial infarction with certainty.     If myocardial infarction is still suspected,     repeat the test at appropriate intervals.     CRITICAL RESULT CALLED TO, READ BACK BY AND VERIFIED WITH:     L THIELEN,RN [id number] 09/24/13 WBOND  CK TOTAL AND CKMB     Status: None   Collection Time    09/24/13  2:39 PM      Result Value Ref Range   Total CK 188  7 - 232 U/L   CK, MB 2.1  0.3 - 4.0 ng/mL   Relative Index 1.1  0.0 - 2.5   No results found.     Medical Problem List and Plan: 1. 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  DVT Prophylaxis/Anticoagulation: Pharmaceutical: Lovenox 3. Pain Management:  Tylenol prn 4. Mood:  Team to provide ego support. LCSW to follow for evaluation and support.  5. Neuropsych: This patient is capable of making decisions on his own behalf. 6. Skin/Wound Care:  Routine pressure relief measures. Patient able to reposition himself without difficulty.  7. HTN: Monitor every 8 hours. Continue Norvasc, lisinopril and labetalol.  8. Tobacco abuse:  2 PPD smoker. Will add nicotine patch  daily to start. Albuterol inhaler prn. Will order CXR for baseline with diffuse wheezing. Likely has COPD.     Post Admission Physician Evaluation: 1. 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 Patient is admitted to receive collaborative, interdisciplinary care between the physiatrist, rehab nursing staff, and therapy team. 3. Patient's level of medical complexity and substantial therapy needs in context of that medical necessity cannot be provided at a lesser intensity  of care such as a SNF. 4. Patient has experienced substantial functional loss from his/her baseline which was documented above under the "Functional History" and "Functional Status" headings.  Judging by the patient's diagnosis, physical exam, and functional history, the patient has potential for functional progress which will result in measurable gains while on inpatient rehab.  These gains will be of substantial and practical use upon discharge  in facilitating mobility and self-care at the household level. 5. Physiatrist will provide 24 hour management of medical needs as well as oversight of the therapy plan/treatment and provide guidance as appropriate regarding the interaction of the two. 6. 24 hour rehab nursing will assist with bladder management, bowel management, safety, skin/wound care, disease management, medication administration and patient education  and help integrate therapy concepts, techniques,education, etc. 7. PT will assess and treat for/with: Lower extremity strength, range of motion, stamina, balance, functional mobility, safety, adaptive techniques and equipment, NMR, visual spatial awareness, proprioception, stroke ed.   Goals are: mod I. 8. OT will assess and treat for/with: ADL's, functional mobility, safety, upper extremity strength, adaptive techniques and equipment, NMR, stroke education, proprioception, family ed.   Goals are: mod I. Therapy may proceed with showering this patient. 9. SLP will assess and treat for/with: speech, language.  Goals are: mod I to supervision. 10. Case Management and Social Worker will assess and treat for psychological issues and discharge planning. 11. Team conference will be held weekly to assess progress toward goals and to determine barriers to discharge. 12. Patient will receive at least 3 hours of therapy per day at least 5 days per week. 13. ELOS: 8-12 days       14. Prognosis:  excellent     Ranelle Oyster, MD, [facility]  Health Physical Medicine & Rehabilitation   09/25/2013

## 2013-09-25 NOTE — Interval H&P Note (Signed)
Jay Wise was admitted today to Inpatient Rehabilitation with the diagnosis of left thalamic hemorrhage.  The patient's history has been reviewed, patient examined, and there is no change in status.  Patient continues to be appropriate for intensive inpatient rehabilitation.  I have reviewed the patient's chart and labs.  Questions were answered to the patient's satisfaction.  SWARTZ,ZACHARY T 09/25/2013, 8:35 PM

## 2013-09-25 NOTE — H&P (Signed)
  Physical Medicine and Rehabilitation Admission H&P    Chief Complaint  Patient presents with  .  Right sided weakness and difficulty speaking.    HPI:   Jay Wise is a 53 y.o. right handed male with history of untreated hypertension, tobacco abuse; who was admitted 09/22/2013 with right-sided weakness and aphasia. Systolic blood pressure 210 at admisison. CT of the brain in the left thalamus 3.3 x 1.7 cm intraventricular extension and mild left-to-right midline shift but no hydrocephalus. Followup cranial CT scan 09/23/2013 unchanged. Dr. 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 Echocardiogram with ejection fraction 55% no wall motion abnormalities. Carotid dopplers without ICA stenosis. Renal artery duplex without RAS but L-RA not insonated. Mild elevation in troponin 0.37 -0.41 felt to be possibly stress ischemia from hemorrhage and no work up as without CP,EKG changes or shortness of breath.  Patient with moderate expressive aphasia with difficulty storing new information. He continues to have resultant right sided weakness, cognitive and perceptual deficits as well as dysarthria. CIR recommended by MD and Rehab team.   Review of Systems  HENT: Negative for hearing loss.   Eyes: Negative for blurred vision and double vision.  Respiratory: Positive for wheezing. Negative for cough and shortness of breath.   Cardiovascular: Negative for chest pain and palpitations.  Gastrointestinal: Negative for heartburn, nausea and constipation.  Genitourinary: Negative for urgency and frequency.  Musculoskeletal: Negative for back pain, myalgias and neck pain.  Neurological: Positive for speech change and focal weakness. Negative for headaches.  Psychiatric/Behavioral: The patient is nervous/anxious.     Past Medical History  Diagnosis Date  . Hypertension    History reviewed. No pertinent past surgical history.  Family History  Problem  Relation Age of Onset  . Cancer Father     Social History:  Married. 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 Independent PTA. He reports that he has been smoking 2 PPD.  He does not have any smokeless tobacco history on file. He reports that he drinks does not drink alcohol--quit few years ago.  He reports that he does not use illicit drugs.    Allergies  Allergen Reactions  . Penicillins Other (See Comments)    unknown  . Sulfa Antibiotics Other (See Comments)    unknown   Medications Prior to Admission  Medication Sig Dispense Refill  . Aspirin-Salicylamide-Caffeine (BC HEADACHE POWDER PO) Take 1 packet by mouth 2 (two) times daily as needed. pain        Home: Home Living Family/patient expects to be discharged to:: Private residence Living Arrangements: Spouse/significant other;Children;Other (Comment) ( 7 and 12 yo children at home) Available Help at Discharge: Available 24 hours/day;Other (Comment) (wife would have to FMLA , wife's parents could help with the) Type of Home: House Home Access: Stairs to enter Entrance Stairs-Number of Steps: 4 Entrance Stairs-Rails: Right Home Layout: Two level;1/2 bath on main level Alternate Level Stairs-Number of Steps: 1 flight Alternate Level Stairs-Rails: Left Home Equipment: None Additional Comments: sales in parts department of [employer]   Lives With: Spouse;Family   Functional History: Prior Function Level of Independence: Independent  Functional Status:  Mobility: Bed Mobility Overal bed mobility: Needs Assistance Bed Mobility: Supine to Sit Supine to sit: Supervision Transfers Overall transfer level: Needs assistance Equipment used: Rolling walker (2 wheeled) Transfers: Sit to/from Stand Sit to Stand: Min assist Stand pivot transfers: Mod assist;+2 physical assistance General transfer comment: Assist for stability Ambulation/Gait Ambulation/Gait assistance: Mod assist   Ambulation Distance (Feet): 46 Feet (23 x 2 one seated  rest break) Assistive device: Rolling walker (2 wheeled) Gait Pattern/deviations: Step-through pattern;Decreased stride length;Steppage;Ataxic;Staggering left;Staggering right;Narrow base of support Gait velocity: decreased Gait velocity interpretation: Below normal speed for age/gender General Gait Details: Patient with significant instability during gait, Max VCs for stride and placement of RLE, patient with decreased R knee flexion and fott clearance secondary to weakness. Manual assist and vcs for hand placement on Rw RUE.    ADL: ADL Overall ADL's : Needs assistance/impaired Eating/Feeding: Set up;Sitting (with L hand ) Grooming: Wash/dry hands;Wash/dry face;Minimal assistance;Sitting Upper Body Bathing: Minimal assitance;Sitting Lower Body Bathing: Sit to/from stand;Minimal assistance Upper Body Dressing : Minimal assistance;Sitting Lower Body Dressing: Minimal assistance;Sit to/from stand Lower Body Dressing Details (indicate cue type and reason): donned his socks independently without LOB Toilet Transfer: Moderate assistance;+2 for physical assistance;Stand-pivot (to recliner) Toilet Transfer Details (indicate cue type and reason): toward R side Functional mobility during ADLs: Moderate assistance General ADL Comments: Balance, incoordination and inattention interfering with ADL.  Cognition: Cognition Overall Cognitive Status: Impaired/Different from baseline Arousal/Alertness: Awake/alert Orientation Level: Oriented X4 (issues with word finding ) Attention: Focused;Sustained;Selective Focused Attention: Appears intact Sustained Attention: Appears intact Selective Attention: Appears intact Memory: Impaired Memory Impairment: Storage deficit;Retrieval deficit Awareness: Appears intact Problem Solving: Impaired Problem Solving Impairment: Verbal complex Cognition Arousal/Alertness: Awake/alert Behavior During Therapy: WFL for tasks assessed/performed Overall Cognitive  Status: Impaired/Different from baseline Area of Impairment: Orientation;Attention;Memory;Following commands;Safety/judgement;Awareness;Problem solving Orientation Level: Person;Place;Time;Situation (required VCs and choices to express proper responses) Current Attention Level: Alternating Following Commands: Follows one step commands consistently Safety/Judgement: Decreased awareness of safety;Decreased awareness of deficits Awareness: Anticipatory Problem Solving: Requires verbal cues;Requires tactile cues General Comments: Patient with difficulty distinguishing left/right discrimination.  Physical Exam: Blood pressure 147/91, pulse 54, temperature 98.1 F (36.7 C), temperature source Oral, resp. rate 15, height 6' (1.829 m), weight 89.2 kg (196 lb 10.4 oz), SpO2 96.00%. Physical Exam  Constitutional: He appears well-developed.  Mouth: dentition fair, oral mucosa pink/moist Eyes: EOM are normal.  Neck: Normal range of motion. Neck supple. No thyromegaly present.  Cardiovascular: Normal rate and regular rhythm. No murmur or rubs Respiratory: Diffuse wheezing noted and generally diminished air movement. No respiratory distress.  GI: Soft. Bowel sounds are normal. He exhibits no distension. Non-tender Neurological: He is alert.  Patient makes good eye contact with examiner. He was able to state his name. He was aphasic. Follows simple commands. Verbal apraxia/expressive language deficits. Able to give me the date with extra time. Could identify 2 simple objects but not stethescope. RUE: 4- shoulder, deltoid, bicept, tricep, grip, +PD. RLE: 4/5 HF, KE, ADF/APF---some apraxia with motor efforts. Sensation diminished slightly to LT and PP. DTR's 1+.  Psych: pleasant and cooperative.   Skin: Skin is warm and dry. A few scattered abrasions    Results for orders placed during the hospital encounter of 09/22/13 (from the past 48 hour(s))  TROPONIN I     Status: Abnormal   Collection Time     09/23/13  8:20 PM      Result Value Ref Range   Troponin I 0.37 (*) <0.30 ng/mL   Comment:            Due to the release kinetics of cTnI,     a negative result within the first hours     of the onset of symptoms does not rule out     myocardial infarction with certainty.     If myocardial   infarction is still suspected,     repeat the test at appropriate intervals.     CRITICAL VALUE NOTED.  VALUE IS CONSISTENT WITH PREVIOUSLY REPORTED AND CALLED VALUE.  CK TOTAL AND CKMB     Status: Abnormal   Collection Time    09/23/13  8:20 PM      Result Value Ref Range   Total CK 313 (*) 7 - 232 U/L   CK, MB 2.8  0.3 - 4.0 ng/mL   Relative Index 0.9  0.0 - 2.5  TROPONIN I     Status: Abnormal   Collection Time    09/24/13  2:39 AM      Result Value Ref Range   Troponin I 0.37 (*) <0.30 ng/mL   Comment:            Due to the release kinetics of cTnI,     a negative result within the first hours     of the onset of symptoms does not rule out     myocardial infarction with certainty.     If myocardial infarction is still suspected,     repeat the test at appropriate intervals.     CRITICAL VALUE NOTED.  VALUE IS CONSISTENT WITH PREVIOUSLY REPORTED AND CALLED VALUE.  CK TOTAL AND CKMB     Status: Abnormal   Collection Time    09/24/13  2:39 AM      Result Value Ref Range   Total CK 264 (*) 7 - 232 U/L   CK, MB 2.2  0.3 - 4.0 ng/mL   Relative Index 0.8  0.0 - 2.5  TROPONIN I     Status: None   Collection Time    09/24/13 12:05 PM      Result Value Ref Range   Troponin I <0.30  <0.30 ng/mL   Comment:            Due to the release kinetics of cTnI,     a negative result within the first hours     of the onset of symptoms does not rule out     myocardial infarction with certainty.     If myocardial infarction is still suspected,     repeat the test at appropriate intervals.  CK TOTAL AND CKMB     Status: None   Collection Time    09/24/13 12:05 PM      Result Value Ref Range    Total CK 223  7 - 232 U/L   CK, MB 2.0  0.3 - 4.0 ng/mL   Relative Index 0.9  0.0 - 2.5  TROPONIN I     Status: Abnormal   Collection Time    09/24/13  2:39 PM      Result Value Ref Range   Troponin I 0.30 (*) <0.30 ng/mL   Comment:            Due to the release kinetics of cTnI,     a negative result within the first hours     of the onset of symptoms does not rule out     myocardial infarction with certainty.     If myocardial infarction is still suspected,     repeat the test at appropriate intervals.     CRITICAL RESULT CALLED TO, READ BACK BY AND VERIFIED WITH:     L THIELEN,RN [id number] 09/24/13 WBOND  CK TOTAL AND CKMB     Status: None   Collection Time    09/24/13    2:39 PM      Result Value Ref Range   Total CK 188  7 - 232 U/L   CK, MB 2.1  0.3 - 4.0 ng/mL   Relative Index 1.1  0.0 - 2.5   No results found.     Medical Problem List and Plan: 1. 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  DVT Prophylaxis/Anticoagulation: Pharmaceutical: Lovenox 3. Pain Management:  Tylenol prn 4. Mood:  Team to provide ego support. LCSW to follow for evaluation and support.  5. Neuropsych: This patient is capable of making decisions on his own behalf. 6. Skin/Wound Care:  Routine pressure relief measures. Patient able to reposition himself without difficulty.  7. HTN: Monitor every 8 hours. Continue Norvasc, lisinopril and labetalol.  8. Tobacco abuse:  2 PPD smoker. Will add nicotine patch 21mg daily to start. Albuterol inhaler prn. Will order CXR for baseline with diffuse wheezing. Likely has COPD.     Post Admission Physician Evaluation: 1. 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 Patient is admitted to receive collaborative, interdisciplinary care between the physiatrist, rehab nursing staff, and therapy team. 3. Patient's level of medical complexity and substantial therapy needs in context of that medical necessity cannot be provided at a lesser intensity  of care such as a SNF. 4. Patient has experienced substantial functional loss from his/her baseline which was documented above under the "Functional History" and "Functional Status" headings.  Judging by the patient's diagnosis, physical exam, and functional history, the patient has potential for functional progress which will result in measurable gains while on inpatient rehab.  These gains will be of substantial and practical use upon discharge  in facilitating mobility and self-care at the household level. 5. Physiatrist will provide 24 hour management of medical needs as well as oversight of the therapy plan/treatment and provide guidance as appropriate regarding the interaction of the two. 6. 24 hour rehab nursing will assist with bladder management, bowel management, safety, skin/wound care, disease management, medication administration and patient education  and help integrate therapy concepts, techniques,education, etc. 7. PT will assess and treat for/with: Lower extremity strength, range of motion, stamina, balance, functional mobility, safety, adaptive techniques and equipment, NMR, visual spatial awareness, proprioception, stroke ed.   Goals are: mod I. 8. OT will assess and treat for/with: ADL's, functional mobility, safety, upper extremity strength, adaptive techniques and equipment, NMR, stroke education, proprioception, family ed.   Goals are: mod I. Therapy may proceed with showering this patient. 9. SLP will assess and treat for/with: speech, language.  Goals are: mod I to supervision. 10. Case Management and Social Worker will assess and treat for psychological issues and discharge planning. 11. Team conference will be held weekly to assess progress toward goals and to determine barriers to discharge. 12. Patient will receive at least 3 hours of therapy per day at least 5 days per week. 13. ELOS: 8-12 days       14. Prognosis:  excellent     Zachary T. Swartz, MD, FAAPMR [facility]   09/25/2013 

## 2013-09-25 NOTE — Progress Notes (Signed)
Speech Language Pathology Treatment: Cognitive-Linquistic  Patient Details Name: Jay Wise MRN: [id number] DOB: 12/26/1960 Today's Date: 09/25/2013 Time: 1110-1140 SLP Time Calculation (min): 30 min  Assessment / Plan / Recommendation Clinical Impression  Skilled treatment focused on addressing language/aphasia goals. SLP directed patient in question-response and conversation, with SLP providing semantic and word cues to increase patient's ability to communicate his thoughts/responses to open-ended questions at phrase and sentence levels. Patient demonstrated awareness to errors in language for biographcial information. 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 Patient's spouse later verified information that patien that patient had given, and she stated that he gets most frustrated with difficulty with his children's names. Overall, patient's receptive language appears to be excellent. Plan for discharge to CIR today   HPI HPI: [name] is a 53 y.o. male with hx of untreated HTN, smoking, alcohol use, brought in by EMS as a code stroke due to right hemiparesis and dysarthria. He did not receive IV t-PA due to hemorrhage. Imaging confirms a left thalamic hemorrhage in the setting of malignant hypertension   Pertinent Vitals Pain Assessment: No/denies pain  SLP Plan     continue with plan of care   Recommendations   CIR admission planned for today          GO     Pablo Lawrence 09/25/2013, 3:38 PM   Angela Nevin, MA, CCC-SLP [facility] Speech-Language Pathologist

## 2013-09-26 ENCOUNTER — Inpatient Hospital Stay (HOSPITAL_COMMUNITY): Payer: PRIVATE HEALTH INSURANCE | Admitting: Occupational Therapy

## 2013-09-26 ENCOUNTER — Inpatient Hospital Stay (HOSPITAL_COMMUNITY): Payer: PRIVATE HEALTH INSURANCE

## 2013-09-26 ENCOUNTER — Inpatient Hospital Stay (HOSPITAL_COMMUNITY): Payer: PRIVATE HEALTH INSURANCE | Admitting: Speech Pathology

## 2013-09-26 ENCOUNTER — Encounter (HOSPITAL_COMMUNITY): Payer: Self-pay | Admitting: *Deleted

## 2013-09-26 DIAGNOSIS — I619 Nontraumatic intracerebral hemorrhage, unspecified: Secondary | ICD-10-CM

## 2013-09-26 DIAGNOSIS — I10 Essential (primary) hypertension: Secondary | ICD-10-CM

## 2013-09-26 NOTE — Plan of Care (Signed)
Problem: RH BLADDER ELIMINATION Goal: RH STG MANAGE BLADDER WITH ASSISTANCE STG Manage Bladder With Assistance  Condom cath in place due to urgency incontinence per patient report

## 2013-09-26 NOTE — Evaluation (Addendum)
Physical Therapy Assessment and Plan  Patient Details  Name: Jay Wise MRN: [id number] Date of Birth: July 30, 1960  PT Diagnosis: Abnormality of gait, Hemiparesis dominant, Impaired cognition, Impaired sensation and Muscle weakness Rehab Potential: Good ELOS: 10-12   Today's Date: 09/26/2013 PT Individual Time: 1400-1500 PT Individual Time Calculation (min): 60 min    Problem List:  Patient Active Problem List   Diagnosis Date Noted  . HTN (hypertension), malignant 09/25/2013  . CVA (cerebral infarction) 09/25/2013  . Stroke due to intracerebral hemorrhage 09/22/2013    Past Medical History:  Past Medical History  Diagnosis Date  . Hypertension    Past Surgical History: No past surgical history on file.  Assessment & Plan Clinical Impression:Jay Wise is a 53 y.o. right handed male with history of untreated hypertension, tobacco abuse; who was admitted 09/22/2013 with right-sided weakness and aphasia. Systolic blood pressure 210 at admisison. CT of the brain in the left thalamus 3.3 x 1.7 cm intraventricular extension and mild left-to-right midline shift but no hydrocephalus. Followup cranial CT scan 09/23/2013 unchanged. Dr. 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 Echocardiogram with ejection fraction 55% no wall motion abnormalities. Carotid dopplers without ICA stenosis. Renal artery duplex without RAS but L-RA not insonated. Mild elevation in troponin 0.37 -0.41 felt to be possibly stress ischemia from hemorrhage and no work up as without CP,EKG changes or shortness of breath. Patient with moderate expressive aphasia with difficulty storing new information. He continues to have resultant right sided weakness, cognitive and perceptual deficits as well as dysarthria   Patient transferred to CIR on 09/25/2013 .   Patient currently requires total +2 assist with mobility secondary to muscle weakness, decreased cardiorespiratoy endurance  and impaired timing and sequencing, unbalanced muscle activation, decreased coordination and decreased motor planning.  Prior to hospitalization, patient was independent  with mobility and lived with Spouse;Family in a House home.  Home access is 4Stairs to enter.  Patient will benefit from skilled PT intervention to maximize safe functional mobility, minimize fall risk and decrease caregiver burden for planned discharge home with 24 hour supervision.  Anticipate patient will benefit from follow up OP at discharge.  PT - End of Session Activity Tolerance: Tolerates < 10 min activity, no significant change in vital signs Endurance Deficit: Yes Endurance Deficit Description: exhausted from sitting up all day PT Assessment Rehab Potential: Good Barriers to Discharge: Decreased caregiver support PT Patient demonstrates impairments in the following area(s): Balance;Endurance;Motor;Safety;Sensory PT Transfers Functional Problem(s): Bed Mobility;Bed to Chair;Car;Furniture;Floor PT Locomotion Functional Problem(s): Ambulation;Wheelchair Mobility;Stairs PT Plan PT Intensity: Minimum of 1-2 x/day ,45 to 90 minutes PT Frequency: 5 out of 7 days PT Duration Estimated Length of Stay: 10-12 PT Treatment/Interventions: Ambulation/gait training;Balance/vestibular training;Discharge planning;Community reintegration;DME/adaptive equipment instruction;Functional electrical stimulation;Functional mobility training;Patient/family education;Neuromuscular re-education;Psychosocial support;Splinting/orthotics;Therapeutic Exercise;Therapeutic Activities;Stair training;UE/LE Strength taining/ROM;UE/LE Coordination activities;Wheelchair propulsion/positioning PT Transfers Anticipated Outcome(s): supervision PT Locomotion Anticipated Outcome(s): supervision gait x 150' and up/down 12 steps 1 rail PT Recommendation Follow Up Recommendations: Outpatient PT Patient destination: Home Equipment Recommended: To be  determined  Skilled Therapeutic Intervention- eval completed.  Tx today- w/c management using bil UEs demonstrating poor safety awareness of RUE, using LUE and LLE x 20' with difficulty coordinating movement and steering with foot, mod assist.  Pt appeared exhausted by end of eval but chose to transfer back to recliner with mod assist due to poor eccentric control/safety awareness. All needs left within reach.  PT Evaluation Precautions/Restrictions Precautions Precautions: Fall Restrictions Weight  Bearing Restrictions: No General   Vital SignsTherapy Vitals Pulse Rate: 51 BP: 163/92 mmHg Patient Position (if appropriate): Sitting Oxygen Therapy SpO2: 100 % O2 Device: None (Room air) Pain Pain Assessment Pain Assessment: No/denies pain Home Living/Prior Functioning Home Living Available Help at Discharge: Available 24 hours/day;Family Type of Home: House Home Access: Stairs to enter [employer] of Steps: 4 Entrance Stairs-Rails: Can reach both;Right Home Layout: Two level Alternate Level Stairs-Number of Steps: pt reports 8 steps Alternate Level Stairs-Rails: Left  Lives With: Spouse;Family Prior Function Level of Independence: Independent with basic ADLs;Independent with gait;Independent with transfers;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)  Able to [place]?: Yes Vocation: Full time employment Vision/Perception-  Pt reported vision is clearing; see OT report Pt demonstrated some R inattention     Cognition Overall Cognitive Status: Within Functional Limits for tasks assessed Arousal/Alertness: Awake/alert Orientation Level: Oriented X4 Decreased attention to R when propelling w/c, slightly impulsive Sensation Sensation Light Touch: Impaired Detail Light Touch Impaired Details: Impaired RLE;Impaired RUE (decreased - numbness) Hot/Cold: Impaired by gross assessment Proprioception: Impaired Detail- delayed perception of  R ankle movements Coordination Heel/shin: impaired RLE excursion, speed, accuracy Fine Motor Movements are Fluid and Coordinated: No Motor  Motor Motor: Hemiplegia;Motor impersistence Motor - Skilled Clinical Observations: decreased strength in R UE and LE and FMC deficits in R UE  Mobility Bed Mobility Bed Mobility: Supine to Sit;Sit to Supine (atypical techniques: pt did not use sidelying at all, but was functional) Rolling Right: 5: Supervision Rolling Right Details: Verbal cues for sequencing;Verbal cues for precautions/safety;Verbal cues for technique;Verbal cues for safe use of DME/AE Supine to Sit: 5: Supervision Sit to Supine: 5: Supervision Transfers Transfers: Yes Stand Pivot Transfers: 3: Mod assist Stand Pivot Transfer Details: Verbal cues for precautions/safety Stand Pivot Transfer Details (indicate cue type and reason): uncontrolled descent and lack of awareness of R foot position during transfer Locomotion  Ambulation Ambulation: Yes Ambulation/Gait Assistance: 1: +2 Total assist Ambulation Distance (Feet): 15 Feet Assistive device: None Ambulation/Gait Assistance Details: Manual facilitation for weight shifting Gait Gait: Yes Gait Pattern: Impaired Gait Pattern: Step-to pattern;Decreased hip/knee flexion - right;Decreased stride length;Decreased dorsiflexion - right;Narrow base of support;Lateral trunk lean to right Gait velocity: decreased Stairs / Additional Locomotion Stairs: No Architect: Yes Wheelchair Assistance: 3: Building surveyor Details: Verbal cues for sequencing;Verbal cues for precautions/safety (poor awareness of R hand position, obstacles.) Wheelchair Propulsion: Both upper extremities Wheelchair Parts Management: Needs assistance Distance: 20  Trunk/Postural Assessment  Cervical Assessment Cervical Assessment: Within Functional Limits Thoracic Assessment Thoracic Assessment: Within Functional  Limits Lumbar Assessment Lumbar Assessment: Within Functional Limits Postural Control Postural Control: Deficits on evaluation Protective Responses: delayed and inadequate Postural Limitations: leans R in standing  Balance Balance Balance Assessed: Yes Static Sitting Balance Static Sitting - Level of Assistance: 5: Stand by assistance Dynamic Sitting Balance Dynamic Sitting - Level of Assistance: 4: Min assist Static Standing Balance Static Standing - Level of Assistance: 3: Mod assist Dynamic Standing Balance Dynamic Standing - Level of Assistance: 1: +2 Total assist Extremity Assessment  RLE Assessment RLE Assessment: Exceptions to [facility] (strength grossly in sitting: hip flexion, knee ext and ankle DF 3-/5) RLE PROM (degrees) RLE Overall PROM Comments: tight hamstrinngs and heel cord9 DF limited to 5 degrees) Trophic changes R lower leg LLE Assessment LLE Assessment: Exceptions to WFL LLE PROM (degrees) LLE Overall PROM Comments: hamstrings and heel cord tight Trophic changes L lower leg  FIM:  FIM - Bed/Chair Transfer Bed/Chair Transfer: 3: Chair or W/C > Bed: Mod A (lift or lower assist);5: Supine > Sit: Supervision (verbal cues/safety issues);5: Sit > Supine: Supervision (verbal cues/safety issues) FIM - Locomotion: Wheelchair Distance: 20 Locomotion: Wheelchair: 2: Travels 50 - 149 ft with moderate assistance (Pt: 50 - 74%) FIM - Locomotion: Ambulation Ambulation/Gait Assistance: 1: +2 Total assist FIM - Locomotion: Stairs Locomotion: Stairs: 0: Activity did not occur   Refer to Care Plan for Long Term Goals  Recommendations for other services: None  Discharge Criteria: Patient will be discharged from PT if patient refuses treatment 3 consecutive times without medical reason, if treatment goals not met, if there is a change in medical status, if patient makes no progress towards goals or if patient is discharged from hospital.  The above assessment, treatment plan,  treatment alternatives and goals were discussed and mutually agreed upon: by patient  Merrick Feutz 09/26/2013, 6:46 PM

## 2013-09-26 NOTE — Progress Notes (Signed)
Occupational Therapy Assessment and Plan  Patient Details  Name: Jay Wise MRN: [id number] Date of Birth: 12-May-1960  OT Diagnosis: cognitive deficits, disturbance of vision, hemiplegia affecting dominant side and muscle weakness (generalized) Rehab Potential: Rehab Potential: Good (for stated goals) ELOS: 10-12 days   Today's Date: 09/26/2013 OT Individual Time:  0900-1000      Problem List:  Patient Active Problem List   Diagnosis Date Noted  . HTN (hypertension), malignant 09/25/2013  . CVA (cerebral infarction) 09/25/2013  . Stroke due to intracerebral hemorrhage 09/22/2013    Past Medical History:  Past Medical History  Diagnosis Date  . Hypertension    Past Surgical History: No past surgical history on file.  Assessment & Plan Clinical Impression: Patient is a 53 y.o. year old right handed male with history of untreated hypertension, tobacco abuse; who was admitted 09/22/2013 with right-sided weakness and aphasia. Systolic blood pressure 751 at [place]. CT of the brain in the left thalamus 3.3 x 1.7 cm intraventricular extension and mild left-to-right midline shift but no hydrocephalus. Followup cranial CT scan 09/23/2013 unchanged. Dr. 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 Echocardiogram with ejection fraction 55% no wall motion abnormalities. Carotid dopplers without ICA stenosis. Renal artery duplex without RAS but L-RA not insonated. Mild elevation in troponin 0.37 -0.41 felt to be possibly stress ischemia from hemorrhage and no work up as without CP,EKG changes or shortness of breath. Patient with moderate expressive aphasia with difficulty storing new information. He continues to have resultant right sided weakness, cognitive and perceptual deficits as well as dysarthria. Patient transferred to CIR on 09/25/2013 .    Patient currently requires mod with basic self-care skills secondary to muscle weakness, decreased  cardiorespiratoy endurance, decreased coordination, decreased visual perceptual skills and field cut, decreased awareness, decreased problem solving and decreased safety awareness and decreased standing balance, hemiplegia and decreased balance strategies.  Prior to hospitalization, patient could complete ADLs, with independent .  Patient will benefit from skilled intervention to increase independence with basic self-care skills prior to discharge home with care partner.  Anticipate patient will require 24 hour supervision and follow up outpatient.  OT - End of Session Activity Tolerance: Tolerates < 10 min activity, no significant change in vital signs Endurance Deficit: Yes OT Assessment Rehab Potential: Good (for stated goals) Barriers to Discharge:  (none known at this time) OT Patient demonstrates impairments in the following area(s): Balance;Cognition;Endurance;Motor;Perception;Safety;Sensory;Vision OT Basic ADL's Functional Problem(s): Grooming;Bathing;Dressing;Toileting OT Advanced ADL's Functional Problem(s):  (N/A) OT Transfers Functional Problem(s): Toilet;Tub/Shower OT Additional Impairment(s): Fuctional Use of Upper Extremity OT Plan OT Intensity: Minimum of 1-2 x/day, 45 to 90 minutes OT Frequency: 5 out of 7 days OT Duration/Estimated Length of Stay: 10-12 days OT Treatment/Interventions: Balance/vestibular training;Cognitive remediation/compensation;Discharge planning;DME/adaptive equipment instruction;Functional mobility training;Psychosocial support;Therapeutic Activities;UE/LE Strength taining/ROM;Visual/perceptual remediation/compensation;UE/LE Coordination activities;Therapeutic Exercise;Self Care/advanced ADL retraining;Patient/family education OT Basic Self-Care Anticipated Outcome(s): supervision OT Toileting Anticipated Outcome(s): supervision OT Bathroom Transfers Anticipated Outcome(s): supervision OT Recommendation Recommendations for Other Services: Neuropsych  consult;Other (comment) (Recreational therapy) Patient destination: Home Follow Up Recommendations: 24 hour supervision/assistance;Outpatient OT Equipment Recommended: Tub/shower bench   Skilled Therapeutic Intervention Pt with no c/o pain this session. OT evaluation initiated and completed. Pt educated on OT purpose, POC, and goals. Pt performing bathing and dressing seated EOB this session. Pt does appear to have R inattention requiring verbal cues for safety. Pt standing for LB bathing and incontinent of bowel onto floor. Pt requires Min - Mod A  for dynamic standing balance during functional task. Please see written evaluation and goals.  OT Evaluation Precautions/Restrictions  Precautions Precautions: Fall Restrictions Weight Bearing Restrictions: No General Chart Reviewed: Yes Pain Pain Assessment Pain Assessment: No/denies pain Pain Score: 0-No pain Home Living/Prior Functioning Home Living Available Help at Discharge: Available 24 hours/day;Family Type of Home: House Home Access: Stairs to enter [employer] of Steps: 4 Entrance Stairs-Rails: Can reach both;Right Home Layout: Two level Alternate Level Stairs-Number of Steps: pt reports 8 steps Alternate Level Stairs-Rails: Left  Lives With: Spouse;Family Prior Function Level of Independence: Independent with basic ADLs;Independent with gait;Independent with transfers;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)  Able to [place]?: Yes Vocation: Full time employment Vision/Perception  Vision- History Baseline Vision/History: No visual deficits Patient Visual Report: No change from baseline Vision- Assessment Vision Assessment?: Vision impaired- to be further tested in functional context  Cognition Overall Cognitive Status: Within Functional Limits for tasks assessed Arousal/Alertness: Awake/alert Orientation Level: Oriented X4 Attention: Focused;Sustained;Selective Focused  Attention: Appears intact Sustained Attention: Appears intact Selective Attention: Appears intact Memory: Impaired Memory Impairment: Storage deficit;Retrieval deficit Awareness: Impaired Problem Solving: Impaired Problem Solving Impairment: Verbal complex Safety/Judgment: Appears intact Sensation Sensation Light Touch: Impaired Detail Light Touch Impaired Details: Impaired RLE;Impaired RUE (decreased - numbness) Hot/Cold: Impaired by gross assessment Proprioception: Impaired Detail Additional Comments: delayed perception, but accurate Coordination Fine Motor Movements are Fluid and Coordinated: No (decreased speed with [place] tasks in R hand) Heel Shin Test: R with impaired speed, accuracy, excursion Motor  Motor Motor: Hemiplegia Motor - Skilled Clinical Observations: decreased strength in R UE and LE and FMC deficits in R UE Mobility  Bed Mobility Bed Mobility: Sit to Supine;Supine to Sit Rolling Right: 5: Supervision Rolling Right Details: Verbal cues for sequencing;Verbal cues for precautions/safety;Verbal cues for technique;Verbal cues for safe use of DME/AE   Balance Min - Mod A with RW during functional activity of bathing/dressing LB. Extremity/Trunk Assessment RUE Assessment RUE Assessment: Exceptions to WFL (AROM is WFLs, decreased strength, encourage use of R UE for functional tasks) LUE Assessment LUE Assessment: Within Functional Limits  FIM:  FIM - Eating Eating Activity: 7: Complete independence:no helper FIM - Lower Body Dressing/Undressing Lower body dressing/undressing steps patient completed: Thread/unthread right underwear leg;Thread/unthread left underwear leg;Thread/unthread left pants leg;Pull pants up/down;Don/Doff right sock;Don/Doff left sock Lower body dressing/undressing: 3: Mod-Patient completed 50-74% of tasks FIM - Toileting Toileting steps completed by patient: Performs perineal hygiene Toileting: 3: Mod-Patient completed 2 of 3 steps FIM -  Radio producer Devices: Elevated toilet seat;Walker Toilet Transfers: 3-To toilet/BSC: Mod A (lift or lower assist);3-From toilet/BSC: Mod A (lift or lower assist) FIM - Tub/Shower Transfers Tub/shower Transfers: 0-Activity did not occur or was simulated   Refer to Care Plan for Long Term Goals  Recommendations for other services: Neuropsych and Other: Recreational therapy  Discharge Criteria: Patient will be discharged from OT if patient refuses treatment 3 consecutive times without medical reason, if treatment goals not met, if there is a change in medical status, if patient makes no progress towards goals or if patient is discharged from hospital.  The above assessment, treatment plan, treatment alternatives and goals were discussed and mutually agreed upon: by patient  Phineas Semen 09/26/2013, 5:25 PM

## 2013-09-26 NOTE — Progress Notes (Signed)
[facility]IC[facility] RE[facility]ABILITATION     PROGRESS NOTE    Subjective/Complaints: Had a reasonable night. Excited to start therapies today. Denies pain,h/a, sob, cough A  review of systems has been performed and if not noted above is otherwise negative.   Objective: Vital Signs: Blood pressure 171/85, pulse 53, temperature 98 F (36.7 C), temperature source Oral, resp. rate 20, height  (1.854 m), weight 87.408 kg (192 lb 11.2 oz), SpO2 98.00%. Dg Chest 2 View  09/25/2013   CLINICAL DATA:  Wheezing, shortness of breath, history of hypertension  EXAM: CHEST  2 VIEW  COMPARISON:  None.  FINDINGS: Enlarged cardiac silhouette, possibly accentuated due to hypoventilation. Atherosclerotic plaque within the thoracic aorta. Ill-defined heterogeneous opacities are seen within the medial aspect of the right upper lung. There is diffuse slightly nodular thickening of the pulmonary interstitium. No pleural effusion or pneumothorax. No evidence of edema. No acute osseus abnormalities.  IMPRESSION: Findings worrisome for developing right upper lung pneumonia superimposed on airways disease / bronchitis. A follow-up chest radiograph 4-6 weeks after treatment is recommended to ensure resolution.   Electronically Signed   By: Simonne Come M.D.   On: 09/25/2013 20:33   No results found for this basename: WBC, HGB, HCT, PLT,  in the last 72 hours No results found for this basename: NA, K, CL, CO, GLUCOSE, BUN, CREATININE, CALCIUM,  in the last 72 hours CBG (last 3)  No results found for this basename: GLUCAP,  in the last 72 hours  Wt Readings from Last 3 Encounters:  09/25/13 87.408 kg (192 lb 11.2 oz)  09/23/13 89.2 kg (196 lb 10.4 oz)    Physical Exam:  Constitutional: He appears well-developed.  Mouth: dentition fair, oral mucosa pink/moist  Eyes: EOM are normal.  Neck: Normal range of motion. Neck supple. No thyromegaly present.  Cardiovascular: Normal rate and regular rhythm. No murmur  or rubs  Respiratory: Diffuse wheezing noted and generally diminished air movement. No respiratory distress.  GI: Soft. Bowel sounds are normal. He exhibits no distension. Non-tender Neurological: He is alert.  Verbal apraxia/expressive language deficits persistent but language functional. Able to give me the date with extra time. Could identify 2 simple objects but not stethescope. RUE: 4- shoulder, deltoid, bicept, tricep, grip, +PD. RLE: 4/5 HF, KE, ADF/APF---some apraxia with motor efforts. Sensation diminished slightly to LT and PP. DTR's 1+.  Psych: pleasant and cooperative.  Skin: Skin is warm and dry. A few scattered abrasions   Assessment/Plan: 1. 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 Physiatrist is providing close team supervision and 24 hour management of active medical problems listed below. Physiatrist and rehab team continue to assess barriers to discharge/monitor patient progress toward functional and medical goals. FIM:                   Comprehension Comprehension Mode: Auditory Comprehension: 5-Follows basic conversation/direction: With no assist  Expression Expression Mode: Verbal Expression: 4-Expresses basic 75 - 89% of the time/requires cueing 10 - 24% of the time. Needs helper to occlude trach/needs to repeat words.  Social Interaction Social Interaction: 4-Interacts appropriately 75 - 89% of the time - Needs redirection for appropriate language or to initiate interaction.  Problem Solving Problem Solving Mode: Not assessed  Memory Memory: 4-Recognizes or recalls 75 - 89% of the time/requires cueing 10 - 24% of the time  Medical Problem List and Plan:  1. 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 DVT Prophylaxis/Anticoagulation: Pharmaceutical: Lovenox  3. Pain Management: Tylenol prn  4. Mood: Team to provide  ego support. LCSW to follow for evaluation and support.  5. Neuropsych: This patient is capable of making decisions on his own behalf.  6. Skin/Wound Care: Routine pressure relief measures. Patient able to reposition himself without difficulty.  7. HTN: Monitor every 8 hours. Continue Norvasc, lisinopril and labetalol.  -bp's trending down. Except a degree of elevation at this stage of recovery  8. Tobacco abuse: 2 PPD smoker. Will add nicotine patch  daily to start. Albuterol inhaler prn. Will order CXR for baseline with diffuse wheezing. Likely has COPD.     LOS (Days) 1 A FACE TO FACE EVALUATION WAS PERFORMED  Tionne Dayhoff T 09/26/2013 8:06 AM

## 2013-09-26 NOTE — Evaluation (Signed)
Speech Language Pathology Assessment and Plan  Patient Details  Name: Jay Wise MRN: [id number] Date of Birth: 1960/10/12  SLP Diagnosis: Aphasia  Rehab Potential: Excellent ELOS: 10-14 days    Today's Date: 09/26/2013 SLP Individual Time: 1030-1130 SLP Individual Time Calculation (min): 60 min   Problem List:  Patient Active Problem List   Diagnosis Date Noted  . HTN (hypertension), malignant 09/25/2013  . CVA (cerebral infarction) 09/25/2013  . 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 Hypertension    Past Surgical History: No past surgical history on file.  Assessment / Plan / Recommendation Clinical Impression Jay Wise is a 53 y.o. right handed male with history of untreated hypertension, tobacco abuse; who was admitted 09/22/2013 with right-sided weakness and aphasia. Systolic blood pressure 826 at admission. CT of the brain in the left thalamus 3.3 x 1.7 cm intraventricular extension and mild left-to-right midline shift but no hydrocephalus. Followup cranial CT scan 09/23/2013 unchanged. Dr. 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 Patient with moderate expressive aphasia with difficulty storing new information. He continues to have resultant right sided weakness, cognitive and perceptual deficits as well as dysarthria. CIR recommended by MD and Rehab team patient admitted 09/25/13.  Orders received; cognitive-linguistic evaluation completed.  Patient demonstrates a moderate expressive aphasia as well as mild high-level receptive and reading comprehension impairments with need for further assessment of writing  as right upper extremity allows.  Basic cognition appears WFL; high-level abilities to be further assessed.  Patient would benefit from skilled SLP intervention to address deficits, in order to maximize his functional independence prior to discharge.  Anticipate patient will require follow up SLP services.   Skilled Therapeutic Interventions          Cognitive-linguistic evaluation completed with results and recommendations reviewed with patient.  SLP also initiated education regarding effective word finding strategies and provided patient with written list.      SLP Assessment  Patient will need skilled [facility] during CIR admission    Recommendations  Oral Care Recommendations: Oral care BID Patient destination: Home Follow up Recommendations: Outpatient SLP Equipment Recommended: None recommended by SLP    SLP Frequency 5 out of 7 days   SLP Treatment/Interventions Cueing hierarchy;Environmental controls;Functional tasks;Internal/external aids;Patient/family education;Speech/Language facilitation    Pain Pain Assessment Pain Assessment: No/denies pain Prior Functioning Cognitive/Linguistic Baseline: Within functional limits Type of Home: House  Lives With: Spouse;Family Available Help at Discharge: Available 24 hours/day;Family Vocation: Full time employment  Short Term Goals: Week 1: SLP Short Term Goal 1 (Week 1): Patient will utilize word finding strategies to express needs and wants with Mod clinician cues SLP Short Term Goal 2 (Week 1): Patient will follow multiple step commands during functional tasks with Min clinician cues SLP Short Term Goal 3 (Week 1): Patient will increase reading comprehension accuracy at the paragraph level with with Mod clinician cues SLP Short Term Goal 4 (Week 1): Patient will complete assessment of written expression  See FIM for current functional status Refer to Care Plan for Long Term Goals  Recommendations for other services: None  Discharge Criteria: Patient will be discharged from SLP if patient refuses treatment 3 consecutive times without medical reason, if treatment goals not met, if there is a change in medical status, if patient makes no progress towards  goals or if patient is discharged from hospital.  The above assessment,  treatment plan, treatment alternatives and goals were discussed and mutually agreed upon: by patient  Gunnar Fusi, M.A., CCC-SLP [phone]  [place] 09/26/2013, 2:45 PM

## 2013-09-27 ENCOUNTER — Inpatient Hospital Stay (HOSPITAL_COMMUNITY): Payer: PRIVATE HEALTH INSURANCE | Admitting: Occupational Therapy

## 2013-09-27 DIAGNOSIS — I10 Essential (primary) hypertension: Secondary | ICD-10-CM

## 2013-09-27 DIAGNOSIS — I619 Nontraumatic intracerebral hemorrhage, unspecified: Secondary | ICD-10-CM

## 2013-09-27 NOTE — Progress Notes (Signed)
Occupational Therapy Session Note  Patient Details  Name: Jay Wise MRN: [id number] Date of Birth: 02-09-1960  Today's Date: 09/27/2013 OT Individual Time: 1191-4782 OT Individual Time Calculation (min): 32 min   Short Term Goals: Week 1:  OT Short Term Goal 1 (Week 1): Pt will attend to R side visual field to prevent injury during functional mobility and to locate ADL items with Min verbal cues. OT Short Term Goal 2 (Week 1): Pt will perform shower transfer with Min A in order to increase I in functional transfers. OT Short Term Goal 3 (Week 1): Pt will perform toileting with Min A in order to increase I in self care. OT Short Term Goal 4 (Week 1): Pt will perform toilet transfer with Min A in order to increase I in functional mobility. OT Short Term Goal 5 (Week 1): Pt will perform 10 minutes of dynamic standing balance activity with min A for balance in order to increase balance strategies for occupational performance.   Skilled Therapeutic Interventions/Progress Updates:  Patient resting in recliner eating lunch upon arrival with wife at his side.  Allowed patient to finish and returned for therapy.  Engaged in self care retraining, increased RUE function, sit><stands, ambulate with RW, practice opening door with RUE, turning left then right, stand at sink to brush teeth and wash hands.  Focued session on forced use of RUE & RLE as patient required verbal and tactile cues to sustain RLE muscle activity while standing at sink therefore practiced squatting while tapping his left toes during task of brushing his teeth.  Patient practiced handwriting and was given some handwriting homework that he chose to place in his notebook.  Therapy Documentation Precautions:  Precautions Precautions: Fall Restrictions Weight Bearing Restrictions: No Pain: 3/10 headache, RN provided medication, rest and reposition. ADL: See FIM for current functional status  Therapy/Group: Individual  Therapy  Conception Doebler 09/27/2013, 7:56 AM

## 2013-09-27 NOTE — Progress Notes (Signed)
[place] PHYSICAL MEDICINE & REHABILITATION     PROGRESS NOTE    Subjective/Complaints: No issues yesterday or overnight. Therapies went well. A  review of systems has been performed and if not noted above is otherwise negative.   Objective: Vital Signs: Blood pressure 159/85, pulse 50, temperature 97.8 F (36.6 C), temperature source Oral, resp. rate 19, height  (1.854 m), weight 87.408 kg (192 lb 11.2 oz), SpO2 98.00%. Dg Chest 2 View  09/25/2013   CLINICAL DATA:  Wheezing, shortness of breath, history of hypertension  EXAM: CHEST  2 VIEW  COMPARISON:  None.  FINDINGS: Enlarged cardiac silhouette, possibly accentuated due to hypoventilation. Atherosclerotic plaque within the thoracic aorta. Ill-defined heterogeneous opacities are seen within the medial aspect of the right upper lung. There is diffuse slightly nodular thickening of the pulmonary interstitium. No pleural effusion or pneumothorax. No evidence of edema. No acute osseus abnormalities.  IMPRESSION: Findings worrisome for developing right upper lung pneumonia superimposed on airways disease / bronchitis. A follow-up chest radiograph 4-6 weeks after treatment is recommended to ensure resolution.   Electronically Signed   By: Simonne Come M.D.   On: 09/25/2013 20:33   No results found for this basename: WBC, HGB, HCT, PLT,  in the last 72 hours No results found for this basename: NA, K, CL, CO, GLUCOSE, BUN, CREATININE, CALCIUM,  in the last 72 hours CBG (last 3)  No results found for this basename: GLUCAP,  in the last 72 hours  Wt Readings from Last 3 Encounters:  09/25/13 87.408 kg (192 lb 11.2 oz)  09/23/13 89.2 kg (196 lb 10.4 oz)    Physical Exam:  Constitutional: He appears well-developed.  Mouth: dentition fair, oral mucosa pink/moist  Eyes: EOM are normal.  Neck: Normal range of motion. Neck supple. No thyromegaly present.  Cardiovascular: Normal rate and regular rhythm. No murmur or rubs  Respiratory:  Diffuse wheezing noted and generally diminished air movement. No respiratory distress.  GI: Soft. Bowel sounds are normal. He exhibits no distension. Non-tender Neurological: He is alert.  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   RUE: 4- to 4/5 shoulder, deltoid, bicept, tricep, grip, +PD. RLE: 4/5 HF, KE, ADF/APF---some apraxia with motor efforts. Sensation diminished slightly to LT and PP. DTR's 1+.  Psych: pleasant and cooperative.  Skin: Skin is warm and dry. A few scattered abrasions   Assessment/Plan: 1. 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 Physiatrist is providing close team supervision and 24 hour management of active medical problems listed below. Physiatrist and rehab team continue to assess barriers to discharge/monitor patient progress toward functional and medical goals. FIM: FIM - Bathing Bathing Steps Patient Completed: Chest;Right Arm;Left Arm;Abdomen;Front perineal area;Buttocks;Right upper leg;Left upper leg Bathing: 4: Min-Patient completes 8-9 39f 10 parts or 75+ percent  FIM - Upper Body Dressing/Undressing Upper body dressing/undressing steps patient completed: Thread/unthread right sleeve of pullover shirt/dresss;Thread/unthread left sleeve of pullover shirt/dress;Put head through opening of pull over shirt/dress;Pull shirt over trunk Upper body dressing/undressing: 5: Set-up assist to: Obtain clothing/put away FIM - Lower Body Dressing/Undressing Lower body dressing/undressing steps patient completed: Thread/unthread right underwear leg;Thread/unthread left underwear leg;Thread/unthread left pants leg;Pull pants up/down;Don/Doff right sock;Don/Doff left sock Lower body dressing/undressing: 3: Mod-Patient completed 50-74% of tasks  FIM - Toileting Toileting steps completed by patient: Performs perineal hygiene Toileting: 3:  Mod-Patient completed 2 of 3 steps  FIM - Best boy  Assistive Devices: Elevated toilet seat;Walker Toilet Transfers: 3-To toilet/BSC: Mod A (lift or lower assist);3-From toilet/BSC: Mod A (lift or lower assist)  FIM - Bed/Chair Transfer Bed/Chair Transfer: 3: Chair or W/C > Bed: Mod A (lift or lower assist);5: Supine > Sit: Supervision (verbal cues/safety issues);5: Sit > Supine: Supervision (verbal cues/safety issues)  FIM - Locomotion: Wheelchair Distance: 20 Locomotion: Wheelchair: 2: Travels 50 - 149 ft with moderate assistance (Pt: 50 - 74%) FIM - Locomotion: Ambulation Ambulation/Gait Assistance: 1: +2 Total assist  Comprehension Comprehension Mode: Auditory Comprehension: 5-Follows basic conversation/direction: With no assist  Expression Expression Mode: Verbal Expression: 4-Expresses basic 75 - 89% of the time/requires cueing 10 - 24% of the time. Needs helper to occlude trach/needs to repeat words.  Social Interaction Social Interaction: 6-Interacts appropriately with others with medication or extra time (anti-anxiety, antidepressant).  Problem Solving Problem Solving Mode: Not assessed Problem Solving: 5-Solves basic 90% of the time/requires cueing < 10% of the time  Memory Memory: 5-Recognizes or recalls 90% of the time/requires cueing < 10% of the time  Medical Problem List and Plan:  1. 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 DVT Prophylaxis/Anticoagulation: Pharmaceutical: Lovenox  3. Pain Management: Tylenol prn  4. Mood: Team to provide ego support. LCSW to follow for evaluation and support.  5. Neuropsych: This patient is capable of making decisions on his own behalf.  6. Skin/Wound Care: Routine pressure relief measures. Patient able to reposition himself without difficulty.  7. HTN: Monitor every 8 hours. Continue Norvasc, lisinopril and labetalol.  -bp's trending down. Except a degree of elevation at this stage of  recovery   -I'm ok with HR in 50's 8. Tobacco abuse: 2 PPD smoker. Will add nicotine patch  daily to start. Albuterol inhaler prn. Will order CXR for baseline with diffuse wheezing. Likely has COPD.     LOS (Days) 2 A FACE TO FACE EVALUATION WAS PERFORMED  Kally Cadden T 09/27/2013 7:54 AM

## 2013-09-28 ENCOUNTER — Inpatient Hospital Stay (HOSPITAL_COMMUNITY): Payer: PRIVATE HEALTH INSURANCE

## 2013-09-28 ENCOUNTER — Inpatient Hospital Stay (HOSPITAL_COMMUNITY): Payer: PRIVATE HEALTH INSURANCE | Admitting: Speech Pathology

## 2013-09-28 ENCOUNTER — Encounter (HOSPITAL_COMMUNITY): Payer: PRIVATE HEALTH INSURANCE | Admitting: Occupational Therapy

## 2013-09-28 DIAGNOSIS — I10 Essential (primary) hypertension: Secondary | ICD-10-CM

## 2013-09-28 DIAGNOSIS — I619 Nontraumatic intracerebral hemorrhage, unspecified: Secondary | ICD-10-CM

## 2013-09-28 LAB — COMPREHENSIVE METABOLIC PANEL
ALT: 57 U/L — ABNORMAL HIGH (ref 0–53)
ANION GAP: 12 (ref 5–15)
AST: 20 U/L (ref 0–37)
Albumin: 3.4 g/dL — ABNORMAL LOW (ref 3.5–5.2)
Alkaline Phosphatase: 77 U/L (ref 39–117)
BUN: 29 mg/dL — AB (ref 6–23)
CALCIUM: 9.1 mg/dL (ref 8.4–10.5)
CO2: 22 meq/L (ref 19–32)
CREATININE: 1.5 mg/dL — AB (ref 0.50–1.35)
Chloride: 104 mEq/L (ref 96–112)
GFR calc non Af Amer: 51 mL/min — ABNORMAL LOW (ref >90)
GFR, EST AFRICAN AMERICAN: 59 mL/min — AB (ref >90)
GLUCOSE: 107 mg/dL — AB (ref 70–99)
Potassium: 4 mEq/L (ref 3.7–5.3)
Sodium: 138 mEq/L (ref 137–147)
Total Bilirubin: 0.7 mg/dL (ref 0.3–1.2)
Total Protein: 6 g/dL (ref 6.0–8.3)

## 2013-09-28 LAB — CBC WITH DIFFERENTIAL/PLATELET
BASOS ABS: 0 10*3/uL (ref 0.0–0.1)
Basophils Relative: 0 % (ref 0–1)
EOS PCT: 1 % (ref 0–5)
Eosinophils Absolute: 0.1 10*3/uL (ref 0.0–0.7)
HEMATOCRIT: 44.2 % (ref 39.0–52.0)
HEMOGLOBIN: 15 g/dL (ref 13.0–17.0)
LYMPHS ABS: 1.2 10*3/uL (ref 0.7–4.0)
LYMPHS PCT: 21 % (ref 12–46)
MCH: 31.7 pg (ref 26.0–34.0)
MCHC: 33.9 g/dL (ref 30.0–36.0)
MCV: 93.4 fL (ref 78.0–100.0)
MONO ABS: 0.6 10*3/uL (ref 0.1–1.0)
MONOS PCT: 9 % (ref 3–12)
Neutro Abs: 4.1 10*3/uL (ref 1.7–7.7)
Neutrophils Relative %: 69 % (ref 43–77)
Platelets: 123 10*3/uL — ABNORMAL LOW (ref 150–400)
RBC: 4.73 MIL/uL (ref 4.22–5.81)
RDW: 14 % (ref 11.5–15.5)
WBC: 5.9 10*3/uL (ref 4.0–10.5)

## 2013-09-28 NOTE — Progress Notes (Signed)
Speech Language Pathology Daily Session Note  Patient Details  Name: Jay Wise MRN: [id number] Date of Birth: October 29, 1960  Today's Date: 09/28/2013 SLP Individual Time: 1130-1200 SLP Individual Time Calculation (min): 30 min  Short Term Goals: Week 1: SLP Short Term Goal 1 (Week 1): Patient will utilize word finding strategies to express needs and wants with Mod clinician cues SLP Short Term Goal 2 (Week 1): Patient will follow multiple step commands during functional tasks with Min clinician cues SLP Short Term Goal 3 (Week 1): Patient will increase reading comprehension accuracy at the paragraph level with with Mod clinician cues SLP Short Term Goal 4 (Week 1): Patient will complete assessment of written expression  Skilled Therapeutic Interventions: Skilled treatment session focused on addressing language goals.  Patient required increased time to accurately sequence 4 step picture cards.  Patient also required Mod cues for awareness of semantic paraphasias and Min semantic cues to correct.  Patient also required Max cues to utilize description during moments of anomia in conversation.  Continue with current plan of care.    FIM:  Comprehension Comprehension Mode: Auditory Comprehension: 5-Follows basic conversation/direction: With no assist Expression Expression Mode: Verbal Expression: 4-Expresses basic 75 - 89% of the time/requires cueing 10 - 24% of the time. Needs helper to occlude trach/needs to repeat words. Social Interaction Social Interaction: 6-Interacts appropriately with others with medication or extra time (anti-anxiety, antidepressant). Problem Solving Problem Solving: 5-Solves basic 90% of the time/requires cueing < 10% of the time Memory Memory: 5-Recognizes or recalls 90% of the time/requires cueing < 10% of the time FIM - Eating Eating Activity: 6: More than reasonable amount of time  Pain Pain Assessment Pain Assessment: No/denies pain  Therapy/Group:  Individual Therapy  Charlane Ferretti., CCC-SLP [phone]  Jay Wise 09/28/2013, 12:09 PM

## 2013-09-28 NOTE — IPOC Note (Addendum)
Overall Plan of Care [facility]) Patient Details Name: Jay Wise MRN: [id number] DOB: 12-29-1960  Admitting Diagnosis: ICH  Hospital Problems: Active Problems:   CVA (cerebral infarction)     Functional Problem List: Nursing Bowel;Bladder;Safety;Nutrition  PT Balance;Endurance;Motor;Safety;Sensory  OT Balance;Cognition;Endurance;Motor;Perception;Safety;Sensory;Vision  SLP Linguistic  TR Activity tolerance, functional mobility, balance, safety        Basic ADL's: OT Grooming;Bathing;Dressing;Toileting     Advanced  ADL's: OT  (N/A)     Transfers: PT Bed Mobility;Bed to Chair;Car;Furniture;Floor  OT Toilet;Tub/Shower     Locomotion: PT Ambulation;Wheelchair Mobility;Stairs     Additional Impairments: OT Fuctional Use of Upper Extremity  SLP Communication comprehension;expression    TR      Anticipated Outcomes Item Anticipated Outcome  Self Feeding    Swallowing      Basic self-care  supervision  Toileting  supervision   Bathroom Transfers supervision  Bowel/Bladder  Mod I  Transfers  supervision  Locomotion  supervision gait x 150' and up/down 12 steps 1 rail  Communication  Min assist   Cognition     Pain  <4  Safety/Judgment  Supervision   Therapy Plan: PT Intensity: Minimum of 1-2 x/day ,45 to 90 minutes PT Frequency: 5 out of 7 days PT Duration Estimated Length of Stay: 10-12 OT Intensity: Minimum of 1-2 x/day, 45 to 90 minutes OT Frequency: 5 out of 7 days OT Duration/Estimated Length of Stay: 10-12 days SLP Intensity: Minumum of 1-2 x/day, 30 to 90 minutes SLP Frequency: 5 out of 7 days SLP Duration/Estimated Length of Stay: 10-14 days  TR Duration/ELOS: 12 Days TR Frequency:  Min 1 time per week >20 minutes        Team Interventions: Nursing Interventions Patient/Family Education;Bladder Management;Bowel Management;Disease Management/Prevention  PT interventions Ambulation/gait training;Balance/vestibular training;Discharge  planning;Community reintegration;DME/adaptive equipment instruction;Functional electrical stimulation;Functional mobility training;Patient/family education;Neuromuscular re-education;Psychosocial support;Splinting/orthotics;Therapeutic Exercise;Therapeutic Activities;Stair training;UE/LE Strength taining/ROM;UE/LE Coordination activities;Wheelchair propulsion/positioning  OT Interventions Balance/vestibular training;Cognitive remediation/compensation;Discharge planning;DME/adaptive equipment instruction;Functional mobility training;Psychosocial support;Therapeutic Activities;UE/LE Strength taining/ROM;Visual/perceptual remediation/compensation;UE/LE Coordination activities;Therapeutic Exercise;Self Care/advanced ADL retraining;Patient/family education  SLP Interventions Cueing hierarchy;Environmental controls;Functional tasks;Internal/external aids;Patient/family education;Speech/Language facilitation  TR Interventions Recreation/leisure participation, Balance/Vestibular training, functional mobility, therapeutic activities, UE/LE strength/coordination, w/c mobility, community reintegration, pt/family education, adaptive equipment instruction/use, discharge planning, psychosocial support  SW/CM Interventions      Team Discharge Planning: Destination: PT-Home ,OT- Home , SLP-Home Projected Follow-up: PT-Outpatient PT, OT-  24 hour supervision/assistance;Outpatient OT, SLP-Outpatient SLP Projected Equipment Needs: PT-To be determined, OT- Tub/shower bench, SLP-None recommended by SLP Equipment Details: PT- , OT-  Patient/family involved in discharge planning: PT- Patient,  OT-Patient, SLP-Patient  MD ELOS: 8-12d Medical Rehab Prognosis:  Excellent Assessment:    Jay Wise is a 53 y.o. right handed male with history of untreated hypertension, tobacco abuse; who was admitted 09/22/2013 with right-sided weakness and aphasia. Systolic blood pressure 210 at admisison. CT of the brain in the left  thalamus 3.3 x 1.7 cm intraventricular extension and mild left-to-right midline shift but no hydrocephalus. Followup cranial CT scan 09/23/2013 unchanged. Dr. 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 Echocardiogram with ejection fraction 55% no wall motion abnormalities. Carotid dopplers without ICA stenosis   Now requiring 24/7 Rehab RN,MD, as well as CIR level PT, OT and SLP.  Treatment team will focus on ADLs, communication and mobility with goals set at Sup/MinA  See Team Conference Notes for weekly updates to the plan of care

## 2013-09-28 NOTE — IPOC Note (Signed)
Overall Plan of Care [facility]) Patient Details Name: Jay Wise MRN: [id number] DOB: Apr 27, 1960  Admitting Diagnosis: ICH  Hospital Problems: Active Problems:   CVA (cerebral infarction)     Functional Problem List: Nursing Bowel;Bladder;Safety;Nutrition  PT Balance;Endurance;Motor;Safety;Sensory  OT Balance;Cognition;Endurance;Motor;Perception;Safety;Sensory;Vision  SLP Linguistic  TR Endurance;Motor;Safety       Basic ADL's: OT Grooming;Bathing;Dressing;Toileting     Advanced  ADL's: OT  (N/A)     Transfers: PT Bed Mobility;Bed to Chair;Car;Furniture;Floor  OT Toilet;Tub/Shower     Locomotion: PT Ambulation;Wheelchair Mobility;Stairs     Additional Impairments: OT Fuctional Use of Upper Extremity  SLP Communication comprehension;expression    TR      Anticipated Outcomes Item Anticipated Outcome  Self Feeding    Swallowing      Basic self-care  supervision  Toileting  supervision   Bathroom Transfers supervision  Bowel/Bladder  Mod I  Transfers  supervision  Locomotion  supervision gait x 150' and up/down 12 steps 1 rail  Communication  Min assist   Cognition     Pain  <4  Safety/Judgment  Supervision   Therapy Plan: PT Intensity: Minimum of 1-2 x/day ,45 to 90 minutes PT Frequency: 5 out of 7 days PT Duration Estimated Length of Stay: 10-12 OT Intensity: Minimum of 1-2 x/day, 45 to 90 minutes OT Frequency: 5 out of 7 days OT Duration/Estimated Length of Stay: 10-12 days SLP Intensity: Minumum of 1-2 x/day, 30 to 90 minutes SLP Frequency: 5 out of 7 days SLP Duration/Estimated Length of Stay: 10-14 days       Team Interventions: Nursing Interventions Patient/Family Education;Bladder Management;Bowel Management;Disease Management/Prevention  PT interventions Ambulation/gait training;Balance/vestibular training;Discharge planning;Community reintegration;DME/adaptive equipment instruction;Functional electrical stimulation;Functional  mobility training;Patient/family education;Neuromuscular re-education;Psychosocial support;Splinting/orthotics;Therapeutic Exercise;Therapeutic Activities;Stair training;UE/LE Strength taining/ROM;UE/LE Coordination activities;Wheelchair propulsion/positioning  OT Interventions Balance/vestibular training;Cognitive remediation/compensation;Discharge planning;DME/adaptive equipment instruction;Functional mobility training;Psychosocial support;Therapeutic Activities;UE/LE Strength taining/ROM;Visual/perceptual remediation/compensation;UE/LE Coordination activities;Therapeutic Exercise;Self Care/advanced ADL retraining;Patient/family education  SLP Interventions Cueing hierarchy;Environmental controls;Functional tasks;Internal/external aids;Patient/family education;Speech/Language facilitation  TR Interventions Adaptive equipment instruction;1:1 session;Balance/vestibular training;Functional mobility training;Community reintegration;Patient/family education;Therapeutic activities;Therapeutic exercise;UE/LE Coordination activities  SW/CM Interventions Discharge Planning;Psychosocial Support;Patient/Family Education    Team Discharge Planning: Destination: PT-Home ,OT- Home , SLP-Home Projected Follow-up: PT-Outpatient PT, OT-  24 hour supervision/assistance;Outpatient OT, SLP-Outpatient SLP Projected Equipment Needs: PT-To be determined, OT- Tub/shower bench, SLP-None recommended by SLP Equipment Details: PT- , OT-  Patient/family involved in discharge planning: PT- Patient,  OT-Patient, SLP-Patient  MD ELOS: 8-12 days Medical Rehab Prognosis:  Excellent Assessment: 53 y.o. right handed male with history of untreated hypertension, tobacco abuse; who was admitted 09/22/2013 with right-sided weakness and aphasia. Systolic blood pressure 210 at admisison. CT of the brain in the left thalamus 3.3 x 1.7 cm intraventricular extension and mild left-to-right midline shift but no hydrocephalus. Followup cranial  CT scan 09/23/2013 unchanged. Dr. 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 Echocardiogram with ejection fraction 55% no wall motion abnormalities. Carotid dopplers without ICA stenosis. Renal artery duplex without RAS but L-RA not insonated   Now requiring 24/7 Rehab RN,MD, as well as CIR level PT, OT and SLP.  Treatment team will focus on ADLs and mobility with goals set at Supervision   See Team Conference Notes for weekly updates to the plan of care

## 2013-09-28 NOTE — Progress Notes (Signed)
Recreational Therapy Assessment and Plan  Patient Details  Name: Jay Wise MRN: [id number] Date of Birth: 1960-08-06 Today's Date: 09/28/2013  Rehab Potential: Good ELOS: 2 weeks   Assessment Clinical Impression: Problem List:  Patient Active Problem List    Diagnosis  Date Noted   .  HTN (hypertension), malignant  09/25/2013   .  CVA (cerebral infarction)  09/25/2013   .  Stroke due to intracerebral hemorrhage  09/22/2013    Past Medical History:  Past Medical History   Diagnosis  Date   .  Hypertension     Past Surgical History: No past surgical history on file.  Assessment & Plan  Clinical Impression:Jay Wise is a 53 y.o. right handed male with history of untreated hypertension, tobacco abuse; who was admitted 09/22/2013 with right-sided weakness and aphasia. Systolic blood pressure 268 at [place]. CT of the brain in the left thalamus 3.3 x 1.7 cm intraventricular extension and mild left-to-right midline shift but no hydrocephalus. Followup cranial CT scan 09/23/2013 unchanged. Dr. 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 Echocardiogram with ejection fraction 55% no wall motion abnormalities. Carotid dopplers without ICA stenosis. Renal artery duplex without RAS but L-RA not insonated. Mild elevation in troponin 0.37 -0.41 felt to be possibly stress ischemia from hemorrhage and no work up as without CP,EKG changes or shortness of breath. Patient with moderate expressive aphasia with difficulty storing new information. He continues to have resultant right sided weakness, cognitive and perceptual deficits as well as dysarthria Patient transferred to CIR on 09/25/2013.  Pt presents with decreased activity tolerance, decreased functional mobility, decreased balance, right sided weakness, decreased coordination, aphasia Limiting pt's independence with leisure/community pursuits. Leisure History/Participation Premorbid leisure  interest/current participation: Sports - Golf;Sports - Exercise (Comment);Sports - Other (Comment);Community - Doctor, hospital - Building control surveyor - Travel (Comment) (soccer, sports that his tow kids are involved with) Other Leisure Interests: Television Leisure Participation Style: With Family/Friends Awareness of Community Resources: Excellent Psychosocial / Spiritual Social interaction - Mood/Behavior: Cooperative Academic librarian Appropriate for Education?: Yes Strengths/Weaknesses Patient Strengths/Abilities: Willingness to participate;Active premorbidly Patient weaknesses: Physical limitations TR Patient demonstrates impairments in the following area(s): Endurance;Motor;Safety  Plan Rec Therapy Plan Is patient appropriate for Therapeutic Recreation?: Yes Rehab Potential: Good Treatment times per week: Min 1 time per week >20 minutes Estimated Length of Stay: 2 weeks TR Treatment/Interventions: Adaptive equipment instruction;1:1 session;Balance/vestibular training;Functional mobility training;Community reintegration;Patient/family education;Therapeutic activities;Therapeutic exercise;UE/LE Coordination activities  Recommendations for other services: None  Discharge Criteria: Patient will be discharged from TR if patient refuses treatment 3 consecutive times without medical reason.  If treatment goals not met, if there is a change in medical status, if patient makes no progress towards goals or if patient is discharged from hospital.  The above assessment, treatment plan, treatment alternatives and goals were discussed and mutually agreed upon: by patient  Botkins 09/28/2013, 1:41 PM

## 2013-09-28 NOTE — Progress Notes (Signed)
Patient with uneventful night.  Slept well.  No complaints voiced.  Incontinent of urine x1 due to urgency.  Denies discomfort.  Bed alarm in use.  Call bell within reach.  Calls for staff appropriately.    Kelli Hope M

## 2013-09-28 NOTE — Progress Notes (Signed)
Social Work Assessment and Plan Social Work Assessment and Plan  Patient Details  Name: Jay Wise MRN: [id number] Date of Birth: Apr 04, 1960  Today's Date: 09/28/2013  Problem List:  Patient Active Problem List   Diagnosis Date Noted  . HTN (hypertension), malignant 09/25/2013  . CVA (cerebral infarction) 09/25/2013  . 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 Hypertension    Past Surgical History: No past surgical history on file. Social History:  reports that he quit smoking 10 days ago. His smoking use included Cigarettes. He smoked 0.00 packs per day. He does not have any smokeless tobacco history on file. He reports that he drinks alcohol. He reports that he does not use illicit drugs.  Family / Support Systems Marital Status: Married Patient Roles: Spouse;Parent;Other (Comment) (Employee) Spouse/Significant Other: Irving Burton  [phone] Other Supports: wife's parents Anticipated Caregiver: Wife to take FMLA to assist pt at discharge Ability/Limitations of Caregiver: No limitations Caregiver Availability: 24/7 Family Dynamics: Close knit family who depend upon each other.  Pt has always been independent and care for himself and this is a different role for him.  He wants to regain this independence while here.  Social History Preferred language: English Religion: None Cultural Background: No issues Education: High School Read: Yes Write: Yes Employment Status: Employed Name of Employer: [employer] Return to Work Plans: Plans to return when able Legal Hisotry/Current Legal Issues: No issues Guardian/Conservator: None-according to MD pt is capable of making his own decisions while here.   Abuse/Neglect Physical Abuse: Denies Verbal Abuse: Denies Sexual Abuse: Denies Exploitation of patient/patient's resources: Denies Self-Neglect: Denies  Emotional Status Pt's affect, behavior adn  adjustment status: Pt is motivated and wants to do as much as he can for himself, he realizes he needs to change his lifestyle habits and is willing to do this. He is prepared to work hard in therapies and recover from this. Recent Psychosocial Issues: Was doing well or so he thought until this happened Pyschiatric History: No history-deferred depression screen at this time due to he is coping appropriately at this time.  He would benefit from seeing the Neuro-psych while here due to his young age. Substance Abuse History: Tobacco realizes he needs to quit.  Aware of programs out there to assist with this.  Patient / Family Perceptions, Expectations & Goals Pt/Family understanding of illness & functional limitations: Pt and wife have a good understanding of his stroke and deficits.  This has taken them by surprise and both realize how important it is to manage your health issues now, especially his blood pressure.  Will work on obtianing pt a PCP before he leaves here. 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, father, Employee, Home owner, etc Anticipated changes in roles/activities/participation: resume Pt/family expectations/goals: Pt states: " I need to be able to take care of myself and will do what I need to do to achieve this."  Wife states: " I am hopeful he will do well here."  [employer]: None Premorbid Home Care/DME Agencies: None Transportation available at discharge: Family Resource referrals recommended: Support group (specify);Neuropsychology (CVA Support group)  Discharge Planning Living Arrangements: Spouse/significant other;Children Support Systems: Spouse/significant other;Children;Other relatives;Friends/neighbors;Church/faith community Type of Residence: Private residence Insurance Resources: Medicaid (specify county);Media planner (specify) Teacher, music) Financial Resources: Employment;Family Support Financial Screen Referred: Previously  completed Living Expenses: Lives with family Money Management: Spouse;Patient Does the patient have any problems obtaining your medications?: No (Had no  PCP) Home Management: Both he and wife Patient/Family Preliminary Plans: Return home with wife who plans to take a FMLA to be able to provide care.  Wife's parents are also available to assist, but still working on this and awaiting pt's progress. Social Work Anticipated Follow Up Needs: HH/OP;Support Group  Clinical Impression Pleasant gentleman who is willing to work hard and recover form this stroke.  He wants to be able to return to work and his normal life, he realizes he needs to change his life style Habits and is prepared to do so.  Supportive wife who is willing to assist and take a FMLA.  Work on a safe discharge plan.  Will work on obtaining a PCP before he leaves here.  Lucy Chris 09/28/2013, 11:35 AM

## 2013-09-28 NOTE — Care Management Note (Signed)
[facility] Individual Statement of Services  Patient Name:  Jay Wise  Date:  09/28/2013  Welcome to the [facility].  Our goal is to provide you with an individualized program based on your diagnosis and situation, designed to meet your specific needs.  With this comprehensive rehabilitation program, you will be expected to participate in at least 3 hours of rehabilitation therapies Monday-Friday, with modified therapy programming on the weekends.  Your rehabilitation program will include the following services:  Physical Therapy (PT), Occupational Therapy (OT), Speech Therapy (ST), 24 hour per day rehabilitation nursing, Neuropsychology, Case Management (Social Worker), Rehabilitation Medicine, Nutrition Services and Pharmacy Services  Weekly team conferences will be held on Wednesday to discuss your progress.  Your Social Worker will talk with you frequently to get your input and to update you on team discussions.  Team conferences with you and your family in attendance may also be held.  Expected length of stay: 10-12 days  Overall anticipated outcome: supervision level  Depending on your progress and recovery, your program may change. Your Social Worker will coordinate services and will keep you informed of any changes. Your Social Worker's name and contact numbers are listed  below.  The following services may also be recommended but are not provided by the [facility]:   Driving Evaluations  Home Health Rehabiltiation Services  Outpatient Rehabilitation Services  Vocational Rehabilitation   Arrangements will be made to provide these services after discharge if needed.  Arrangements include referral to agencies that provide these services.  Your insurance has been verified to be:  medcost Your primary doctor is:  none  Pertinent information will be shared with your doctor and your insurance company.  Social Worker:   Dossie Der, SW [phone] or ([phone]  Information discussed with and copy given to patient by: Lucy Chris, 09/28/2013, 11:20 AM

## 2013-09-28 NOTE — Progress Notes (Signed)
Occupational Therapy Session Note  Patient Details  Name: Jay Wise MRN: [id number] Date of Birth: 1960/12/18  Today's Date: 09/28/2013 OT Individual Time: 1015-1100 OT Individual Time Calculation (min): 45 min    Short Term Goals: Week 1:  OT Short Term Goal 1 (Week 1): Pt will attend to R side visual field to prevent injury during functional mobility and to locate ADL items with Min verbal cues. OT Short Term Goal 2 (Week 1): Pt will perform shower transfer with Min A in order to increase I in functional transfers. OT Short Term Goal 3 (Week 1): Pt will perform toileting with Min A in order to increase I in self care. OT Short Term Goal 4 (Week 1): Pt will perform toilet transfer with Min A in order to increase I in functional mobility. OT Short Term Goal 5 (Week 1): Pt will perform 10 minutes of dynamic standing balance activity with min A for balance in order to increase balance strategies for occupational performance.   Skilled Therapeutic Interventions/Progress Updates:    Pt scheduled for B/D this am session. Pt stated he had already bathed and dressed with nursing only needed minimal Assist. Pt stated he would like to work on his R side strength and that he was having difficulty visually focusing out of his R eye. Pt taken to gym via w/c to address his concerns. He transferred to mat with min A with focus on R foot placement. From mat, he worked on sit >< stand 6 reps, 2 sets without using UE to focus on RLE eccentric strength. Steady A on stand, min A to fully bring trunk forward on sit.   Rue strengthening with 2# hand weight for shoulder control exercises and orange theraband for scapular stability exercises. Provided pt with the band and demonstrated exercises he can do in the room. Pt stated he understood. To address visual tracking, pt scanned newspaper headlines circling the letter O. With reading, he lost his place so used note card under each sentence to help him maintain visual  tracking on each word. Pt stated it made his eye tired, but that it was getting easier the more he engaged.  Encouraged pt to continue the activity in his room. Pt returned to his room with QRB on in w/c, call light in reach.  Therapy Documentation Precautions:  Precautions Precautions: Fall Restrictions Weight Bearing Restrictions: No   Pain: Pain Assessment Pain Assessment: No/denies pain ADL:  See FIM for current functional status  Therapy/Group: Individual Therapy  [name] 09/28/2013, 12:50 PM

## 2013-09-28 NOTE — Progress Notes (Signed)
[facility] PHYSICAL MEDICINE & REHABILITATION     PROGRESS NOTE    Subjective/Complaints: Slept well, remains aphasic with word substitution Poor safety awareness stands without sup at times A  review of systems has been performed and if not noted above is otherwise negative.   Objective: Vital Signs: Blood pressure 150/70, pulse 54, temperature 97.9 F (36.6 C), temperature source Oral, resp. rate 19, height  (1.854 m), weight 87.408 kg (192 lb 11.2 oz), SpO2 100.00%. No results found.  Recent Labs  09/28/13 0530  WBC 5.9  HGB 15.0  HCT 44.2  PLT 123*    Recent Labs  09/28/13 0530  NA 138  K 4.0  CL 104  GLUCOSE 107*  BUN 29*  CREATININE 1.50*  CALCIUM 9.1   CBG (last 3)  No results found for this basename: GLUCAP,  in the last 72 hours  Wt Readings from Last 3 Encounters:  09/25/13 87.408 kg (192 lb 11.2 oz)  09/23/13 89.2 kg (196 lb 10.4 oz)    Physical Exam:  Constitutional: He appears well-developed.  Mouth: dentition fair, oral mucosa pink/moist  Eyes: EOM are normal.  Neck: Normal range of motion. Neck supple. No thyromegaly present.  Cardiovascular: Normal rate and regular rhythm. No murmur or rubs  Respiratory: Diffuse wheezing noted and generally diminished air movement. No respiratory distress.  GI: Soft. Bowel sounds are normal. He exhibits no distension. Non-tender Neurological: He is alert.  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   RUE: 4- to 4/5 shoulder, deltoid, bicept, tricep, grip, +PD. RLE: 4/5 HF, KE, ADF/APF---some apraxia with motor efforts. Sensation diminished slightly to LT and PP. DTR's 1+.  Psych: pleasant and cooperative.  Skin: Skin is warm and dry. A few scattered abrasions   Assessment/Plan: 1. 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 Physiatrist is providing close team  supervision and 24 hour management of active medical problems listed below. Physiatrist and rehab team continue to assess barriers to discharge/monitor patient progress toward functional and medical goals. FIM: FIM - Bathing Bathing Steps Patient Completed: Chest;Right Arm;Left Arm;Abdomen;Front perineal area;Buttocks;Right upper leg;Left upper leg;Right lower leg (including foot);Left lower leg (including foot) Bathing: 4: Steadying assist  FIM - Upper Body Dressing/Undressing Upper body dressing/undressing steps patient completed: Thread/unthread right sleeve of pullover shirt/dresss;Thread/unthread left sleeve of pullover shirt/dress;Put head through opening of pull over shirt/dress;Pull shirt over trunk Upper body dressing/undressing: 5: Set-up assist to: Obtain clothing/put away FIM - Lower Body Dressing/Undressing Lower body dressing/undressing steps patient completed: Thread/unthread right pants leg;Thread/unthread left pants leg;Pull pants up/down Lower body dressing/undressing: 3: Mod-Patient completed 50-74% of tasks  FIM - Toileting Toileting steps completed by patient: Performs perineal hygiene Toileting: 2: Max-Patient completed 1 of 3 steps  FIM - Diplomatic Services operational officer Devices: Grab bars Toilet Transfers: 3-To toilet/BSC: Mod A (lift or lower assist);3-From toilet/BSC: Mod A (lift or lower assist)  FIM - Bed/Chair Transfer Bed/Chair Transfer: 3: Bed > Chair or W/C: Mod A (lift or lower assist);3: Chair or W/C > Bed: Mod A (lift or lower assist)  FIM - Locomotion: Wheelchair Distance: 20 Locomotion: Wheelchair: 2: Travels 50 - 149 ft with moderate assistance (Pt: 50 - 74%) FIM - Locomotion: Ambulation Ambulation/Gait Assistance: 1: +2 Total assist  Comprehension Comprehension Mode: Auditory Comprehension: 5-Follows basic conversation/direction: With no assist  Expression Expression Mode: Verbal Expression: 4-Expresses basic 75 - 89% of the  time/requires cueing 10 -  24% of the time. Needs helper to occlude trach/needs to repeat words.  Social Interaction Social Interaction: 6-Interacts appropriately with others with medication or extra time (anti-anxiety, antidepressant).  Problem Solving Problem Solving Mode: Not assessed Problem Solving: 5-Solves basic 90% of the time/requires cueing < 10% of the time  Memory Memory: 5-Recognizes or recalls 90% of the time/requires cueing < 10% of the time  Medical Problem List and Plan:  1. 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 DVT Prophylaxis/Anticoagulation: Pharmaceutical: Lovenox  3. Pain Management: Tylenol prn  4. Mood: Team to provide ego support. LCSW to follow for evaluation and support.  5. Neuropsych: This patient is capable of making decisions on his own behalf.  6. Skin/Wound Care: Routine pressure relief measures. Patient able to reposition himself without difficulty.  7. HTN: Monitor every 8 hours. Continue Norvasc, lisinopril and labetalol.  -bp's trending down. Except a degree of elevation at this stage of recovery   -I'm ok with HR in 50's 8. Tobacco abuse: 2 PPD smoker. Will add nicotine patch  daily to start. Albuterol inhaler prn. Will order CXR for baseline with diffuse wheezing. Likely has COPD.     LOS (Days) 3 A FACE TO FACE EVALUATION WAS PERFORMED  Erick Colace 09/28/2013 7:42 AM

## 2013-09-28 NOTE — Progress Notes (Addendum)
Physical Therapy Session Note  Patient Details  Name: Jay Wise MRN: [id number] Date of Birth: Aug 27, 1960  Today's Date: 09/28/2013 PT Individual Time: 0802-0902 ; 4098-1191  PT Individual Time Calculation (min): 60 min , 45 min  Short Term Goals: Week 1:  PT Short Term Goal 1 (Week 1): pt will transfer with min assist consistently to R and L PT Short Term Goal 2 (Week 1): pt will propel w/c x 150' with supervision PT Short Term Goal 3 (Week 1): pt will perform gait x 150' with min assist PT Short Term Goal 4 (Week 1): pt will ascend and descend 5 steps with min assist  Skilled Therapeutic Interventions/Progress Updates:  Tx 1: focused on neuro re-ed, transfers, activity tolerance, gait  Recliner > w/c stand pivot transfer to R, with mod cues for technique to slow down, keep R foot away from L foot, etc, with min assist.  Improved eccentric control today vs 9/5.  neuromuscular re-education via demo, VCs for -RLE use for w/c propulsion using bil LEs x 50' -Nu Step at level 4 x 10 minutes, focusing on R hip control for neutral rotation  -trunk lengthening/shortening/rotating in unsupported sitting, reaching out of BOS R and L; R hand able to grasp 7/7 cone attempts -R LE step/taps onto 4" high step with bil UE support, x 10, with multi-modal cues for toe clearance; minimal hamstring activation noted -gait with RW x 10' x 2 with min assist, cues for wider stance, R knee flexion.  Sit>< stand with cues for safe hand placement  Up/down 5 steps 2 rails with min assist, mod cues for sequence and technique.  Tx 2:  Recliner > w/c stand pivot to R with min assist, mod VCs for technique, without use of UEs.  Pt tends to stand up with R foot forward, unequal wt bearing. At end of session, w/c> recliner to L, stand pivot with min assist, mod cues.   neuromuscular re-education as above, for:  - R hip control on NuStep at level 4, x 5 minutes -[place] A exs in standing- 12x 1 bil mini squats,  10 x 1 calf raises with RUE elevated for trunk extension -R hip and knee stance stability during step/taps -R foot placement below knee and = wt bearing during R and L scooting on mat -forward wt shifting during repetitive sit>< stand -gait with grocery cart x 90' with min assist for straight path, mod multi-modal cues for R hip activation for stance width, foot clearance   Pt required several seated rest breaks between tasks.  Pt returned to room, transferred to recliner.  Quick release belt applied and all needs left within reach.    Therapy Documentation Precautions:  Precautions Precautions: Fall Restrictions Weight Bearing Restrictions: No   Vital Signs: Therapy Vitals Pulse Rate: 52 AM session Patient Position (if appropriate): Sitting SpO2: 98 %, AM session O2 Device: None (Room air) Pain: Pain Assessment Pain Assessment: No/denies pain    See FIM for current functional status  Therapy/Group: Individual Therapy  Wyn Nettle 09/28/2013, 10:05 AM

## 2013-09-29 ENCOUNTER — Inpatient Hospital Stay (HOSPITAL_COMMUNITY): Payer: PRIVATE HEALTH INSURANCE | Admitting: Rehabilitation

## 2013-09-29 ENCOUNTER — Ambulatory Visit (HOSPITAL_COMMUNITY): Payer: PRIVATE HEALTH INSURANCE | Admitting: Occupational Therapy

## 2013-09-29 ENCOUNTER — Encounter (HOSPITAL_COMMUNITY): Payer: PRIVATE HEALTH INSURANCE | Admitting: Occupational Therapy

## 2013-09-29 ENCOUNTER — Inpatient Hospital Stay (HOSPITAL_COMMUNITY): Payer: PRIVATE HEALTH INSURANCE | Admitting: Speech Pathology

## 2013-09-29 DIAGNOSIS — I10 Essential (primary) hypertension: Secondary | ICD-10-CM

## 2013-09-29 DIAGNOSIS — E860 Dehydration: Secondary | ICD-10-CM | POA: Diagnosis not present

## 2013-09-29 DIAGNOSIS — I619 Nontraumatic intracerebral hemorrhage, unspecified: Secondary | ICD-10-CM

## 2013-09-29 DIAGNOSIS — F17200 Nicotine dependence, unspecified, uncomplicated: Secondary | ICD-10-CM | POA: Diagnosis present

## 2013-09-29 DIAGNOSIS — D696 Thrombocytopenia, unspecified: Secondary | ICD-10-CM | POA: Diagnosis present

## 2013-09-29 LAB — METANEPHRINES, PLASMA
METANEPHRINE FREE: 80 pg/mL — AB (ref <=57)
Normetanephrine, Free: 214 pg/mL — ABNORMAL HIGH (ref <=148)
Total Metanephrines-Plasma: 294 pg/mL — ABNORMAL HIGH (ref <=205)

## 2013-09-29 MED ORDER — IPRATROPIUM-ALBUTEROL 0.5-2.5 (3) MG/3ML IN SOLN
3.0000 mL | Freq: Four times a day (QID) | RESPIRATORY_TRACT | Status: DC
  Administered 2013-09-29 – 2013-10-01 (×9): 3 mL via RESPIRATORY_TRACT
  Filled 2013-09-29 (×11): qty 3

## 2013-09-29 MED ORDER — LEVOFLOXACIN 500 MG PO TABS
500.0000 mg | ORAL_TABLET | Freq: Every day | ORAL | Status: DC
  Administered 2013-09-29 – 2013-10-06 (×8): 500 mg via ORAL
  Filled 2013-09-29 (×9): qty 1

## 2013-09-29 NOTE — Progress Notes (Signed)
Patient with onset of diarrhea last pm--foul smelling per NT. Will d/c senna. Check C diff. Push po fluids due to acute renal failure. Will monitor for now. Recheck lytes as well as CBC for follow up on thurs.

## 2013-09-29 NOTE — Progress Notes (Signed)
Occupational Therapy Session Note  Patient Details  Name: Jay Wise MRN: [id number] Date of Birth: Sep 16, 1960  Today's Date: 09/29/2013 OT Individual Time: 4098-1191 OT Individual Time Calculation (min): 45 min    Short Term Goals: Week 1:  OT Short Term Goal 1 (Week 1): Pt will attend to R side visual field to prevent injury during functional mobility and to locate ADL items with Min verbal cues. OT Short Term Goal 2 (Week 1): Pt will perform shower transfer with Min A in order to increase I in functional transfers. OT Short Term Goal 3 (Week 1): Pt will perform toileting with Min A in order to increase I in self care. OT Short Term Goal 4 (Week 1): Pt will perform toilet transfer with Min A in order to increase I in functional mobility. OT Short Term Goal 5 (Week 1): Pt will perform 10 minutes of dynamic standing balance activity with min A for balance in order to increase balance strategies for occupational performance.   Skilled Therapeutic Interventions/Progress Updates:    Pt scheduled for B/D this am, pt received sitting in recliner. Pt was told he would take a shower and dress for his OT session, but pt clearly stated that he had been bathed earlier by the NT.  He was asked a 2nd time if he would like to shower and he stated that he had already showered and would like to just go to the gym.  (Later during session, his NT stated he did not shower and that only his bottom was cleaned up after diarrhea). Pt will need to shower tomorrow.  Pt taken to gym via w/c. Once in gym, this clinician noticed that the pt's hands were very soiled from bowel movement all over hand and nail beds. Pt's hands were scrubbed thoroughly and hand sanitizer applied. Pt was not aware. He used RW to take several steps to mat with min A. Sitting EOM, pt worked on [employer] and coordination exercises using small weighted ball to reach and lift arm/hand in various directions. Pt could only accomplish a few  reps at a time due to muscular fatigue, but he persevered through the session and worked on strengthening for 20 min.  He also worked on standing balance, FMC, and visual scanning as he stood to assemble various size nuts, bolts, washers. No LOB in standing, but pt would occasionally lean to R slightly. Cues needed occasionally to remind him to extend his RLE and to use R hand to twist nut on versus using R hand as a stabilizer.  Pt's next PT session was in 15 minutes. Pt opted to wait and rest in w/c.    Therapy Documentation Precautions:  Precautions Precautions: Fall Restrictions Weight Bearing Restrictions: No    Vital Signs: Therapy Vitals Pulse Rate: 50 Resp: 20 BP: 166/73 mmHg Patient Position (if appropriate): Sitting Oxygen Therapy SpO2: 99 % O2 Device: None (Room air) Pain: Pain Assessment Pain Assessment: 0-10 Pain Score: 4  Pain Type: Acute pain Pain Location: Head Pain Orientation: Posterior Pain Descriptors / Indicators: Aching;Headache Pain Frequency: Intermittent Pain Onset: Gradual Patients Stated Pain Goal: 1 Pain Intervention(s): Medication (See eMAR)  ADL:  See FIM for current functional status  Therapy/Group: Individual Therapy  Penina Reisner 09/29/2013, 11:32 AM

## 2013-09-29 NOTE — Progress Notes (Addendum)
Occupational Therapy Session Note  Patient Details  Name: Jay Wise MRN: [id number] Date of Birth: 31-Mar-1960  Today's Date: 09/29/2013 OT Individual Time: 1478-2956 OT Individual Time Calculation (min): 34 min    Skilled Therapeutic Interventions/Progress Updates:    Pt worked on standing balance, weightshifts in standing, and functional mobility during the session.  He was able to stand with feet close together and eyes open without loss of balance.  When asked to walk to the gym he demonstrated decreased weightshift to the left and decreased ability at times to clear his right toe for adequate step.  Therapist provided min facilitation for weightshift to the left.  Pt also over compensating with increased hip hike on the right side as well.  In the gym began with pt using the [employer] to work on static standing weightshifts for 3 mins.  Pt with increased difficulty with weightshifting to the left as well as left and posteriorly.  Progressed to having pt work on weightshifting in standing by picking his LLE off of the floor and placing on block.  Pt was able to maintain balance with the LLE on the block and min assist.  When attempting to stand with the LLE on the block he would need mod assist to keep from falling forward and to the right.  Ambulated back to the room after 3 intervals of standing with min assist.    Therapy Documentation Precautions:  Precautions Precautions: Fall Restrictions Weight Bearing Restrictions: No  Pain: Pain Assessment Pain Assessment: No/denies pain ADL: See FIM for current functional status  Therapy/Group: Individual Therapy  Arlind Klingerman OTR/L 09/29/2013, 3:59 PM

## 2013-09-29 NOTE — Progress Notes (Signed)
Patient information reviewed and entered into [facility] system by Londyn Wotton, RN, CRRN, PPS Coordinator.  Information including medical coding and functional independence measure will be reviewed and updated through discharge.    

## 2013-09-29 NOTE — Progress Notes (Signed)
Physical Therapy Session Note  Patient Details  Name: Jay Wise MRN: [id number] Date of Birth: [birth year]  Today's Date: 09/29/2013 PT Individual Time: 7829-5621 PT Individual Time Calculation (min): 46 min   Short Term Goals: Week 1:  PT Short Term Goal 1 (Week 1): pt will transfer with min assist consistently to R and L PT Short Term Goal 2 (Week 1): pt will propel w/c x 150' with supervision PT Short Term Goal 3 (Week 1): pt will perform gait x 150' with min assist PT Short Term Goal 4 (Week 1): pt will ascend and descend 5 steps with min assist  Skilled Therapeutic Interventions/Progress Updates:   Pt received sitting in w/c in therapy gym, having completed OT session prior to PT session.  Skilled session focused on NMR for RUE/LE with WB during seated nustep, gait, and tall kneeling activities.  Performed 8 mins on seated nustep at level 4-5 resistance with BUE/LEs.  Pt provided cues for maintaining RLE in straight alignment to decrease ER.  Pt did well with task and only requires 2-3 cues throughout.  Then performed gait x 79' with RW to assess RLE function.  Note decreased ankle DF at times and decreased hamstring.  In sitting pt able to perform DF, therefore donned ace wrap for DF assist and to increase proprioceptive feedback.  Performed another 100' x 2 reps without AD with ace wrap at min A with therapist provided cues at trunk/ribs for weight shift, as well as slight trunk rotation on R with cues for upright posture and increased step length on RLE.  Tolerated well with standing rest break in between.  Ended session with tall kneeling task reaching from L to far R with RUE placing cones to increase WB and weight shift on RLE and coordination and compensation with vision when placing cones.  Also worked in quadruped performing "crawling" forwards x 3 steps and retro stepping x 3 steps for 4 reps total with rest break in between.  Pt tolerated well with moderate fatigue noted.  Also note  small skin tear on R shin, provided compression then band aide, RN made aware.  Pt assisted back to room and left in recliner with quick release belt and all needs in reach.  Max education on calling for assist as pt is high fall risk at this time.  Pt verbalized understanding.    Therapy Documentation Precautions:  Precautions Precautions: Fall Restrictions Weight Bearing Restrictions: No   Vital Signs: Therapy Vitals Pulse Rate: 55 BP: 166/73 mmHg Pain: Pain Assessment Pain Assessment: 0-10 Pain Score: 4  Pain Type: Acute pain Pain Location: Head Pain Orientation: Posterior Pain Descriptors / Indicators: Aching;Headache Pain Frequency: Intermittent Pain Onset: Gradual Patients Stated Pain Goal: 1 Pain Intervention(s): Medication (See eMAR)   Locomotion : Ambulation Ambulation/Gait Assistance: 4: Min assist   See FIM for current functional status  Therapy/Group: Individual Therapy  Vista Deck 09/29/2013, 10:47 AM

## 2013-09-29 NOTE — Progress Notes (Addendum)
[facility] PHYSICAL MEDICINE & REHABILITATION     PROGRESS NOTE    Subjective/Complaints: Slept well, remains aphasic , worked hard in therapy yesterday A  review of systems has been performed and if not noted above is otherwise negative.   Objective: Vital Signs: Blood pressure 184/79, pulse 51, temperature 97.7 F (36.5 C), temperature source Oral, resp. rate 20, height  (1.854 m), weight 87.408 kg (192 lb 11.2 oz), SpO2 97.00%. No results found.  Recent Labs  09/28/13 0530  WBC 5.9  HGB 15.0  HCT 44.2  PLT 123*    Recent Labs  09/28/13 0530  NA 138  K 4.0  CL 104  GLUCOSE 107*  BUN 29*  CREATININE 1.50*  CALCIUM 9.1   CBG (last 3)  No results found for this basename: GLUCAP,  in the last 72 hours  Wt Readings from Last 3 Encounters:  09/25/13 87.408 kg (192 lb 11.2 oz)  09/23/13 89.2 kg (196 lb 10.4 oz)    Physical Exam:  Constitutional: He appears well-developed.  Mouth: dentition fair, oral mucosa pink/moist  Eyes: EOM are normal.  Neck: Normal range of motion. Neck supple. No thyromegaly present.  Cardiovascular: Normal rate and regular rhythm. No murmur or rubs  Respiratory: Diffuse wheezing noted and generally diminished air movement. No respiratory distress.  GI: Soft. Bowel sounds are normal. He exhibits no distension. Non-tender Neurological: He is alert.  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   RUE: 4- to 4/5 shoulder, deltoid, bicept, tricep, grip, +PD. RLE: 4/5 HF, KE, ADF/APF---some apraxia with motor efforts. Sensation diminished slightly to LT and PP. DTR's 1+.  Psych: pleasant and cooperative.  Skin: Skin is warm and dry. A few scattered abrasions   Assessment/Plan: 1. 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 Physiatrist is providing close team supervision and 24 hour management of active  medical problems listed below. Physiatrist and rehab team continue to assess barriers to discharge/monitor patient progress toward functional and medical goals. FIM: FIM - Bathing Bathing Steps Patient Completed: Chest;Right Arm;Left Arm;Abdomen;Front perineal area;Buttocks;Right upper leg;Left upper leg;Right lower leg (including foot);Left lower leg (including foot) Bathing: 4: Steadying assist  FIM - Upper Body Dressing/Undressing Upper body dressing/undressing steps patient completed: Thread/unthread right sleeve of pullover shirt/dresss;Thread/unthread left sleeve of pullover shirt/dress;Put head through opening of pull over shirt/dress;Pull shirt over trunk Upper body dressing/undressing: 5: Set-up assist to: Obtain clothing/put away FIM - Lower Body Dressing/Undressing Lower body dressing/undressing steps patient completed: Thread/unthread right pants leg;Thread/unthread left pants leg;Pull pants up/down Lower body dressing/undressing: 3: Mod-Patient completed 50-74% of tasks  FIM - Toileting Toileting steps completed by patient: Performs perineal hygiene Toileting Assistive Devices: Grab bar or rail for support Toileting: 2: Max-Patient completed 1 of 3 steps  FIM - Diplomatic Services operational officer Devices: Grab bars Toilet Transfers: 3-To toilet/BSC: Mod A (lift or lower assist);3-From toilet/BSC: Mod A (lift or lower assist)  FIM - Press photographer Assistive Devices: Arm rests Bed/Chair Transfer: 4: Chair or W/C > Bed: Min A (steadying Pt. > 75%);4: Bed > Chair or W/C: Min A (steadying Pt. > 75%)  FIM - Locomotion: Wheelchair Distance: 20 Locomotion: Wheelchair: 2: Travels 50 - 149 ft with supervision, cueing or coaxing FIM - Locomotion: Ambulation Locomotion: Ambulation Assistive Devices: Other (comment) (grocery cart) Ambulation/Gait Assistance: 4: Min assist Locomotion: Ambulation: 2: Travels 50 - 149 ft with minimal assistance  (Pt.>75%)  Comprehension Comprehension Mode: Auditory Comprehension: 5-Follows basic conversation/direction: With no assist  Expression Expression Mode: Verbal Expression: 4-Expresses basic 75 - 89% of the time/requires cueing 10 - 24% of the time. Needs helper to occlude trach/needs to repeat words.  Social Interaction Social Interaction: 5-Interacts appropriately 90% of the time - Needs monitoring or encouragement for participation or interaction.  Problem Solving Problem Solving Mode: Not assessed Problem Solving: 5-Solves complex 90% of the time/cues < 10% of the time  Memory Memory: 2-Recognizes or recalls 25 - 49% of the time/requires cueing 51 - 75% of the time  Medical Problem List and Plan:  1. 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 DVT Prophylaxis/Anticoagulation: Pharmaceutical: Lovenox  3. Pain Management: Tylenol prn  4. Mood: Team to provide ego support. LCSW to follow for evaluation and support.  5. Neuropsych: This patient is capable of making decisions on his own behalf.  6. Skin/Wound Care: Routine pressure relief measures. Patient able to reposition himself without difficulty.  7. HTN: Monitor every 8 hours. Continue Norvasc, lisinopril and labetalol.  -bp's trending down.elevated BP this am but generally in 140-150 range, accepatable, monitor  - HR in 50-60s 8. Tobacco abuse: 2 PPD smoker. Will add nicotine patch  daily to start. Albuterol inhaler prn.  CXR findings of bronchitis +/- PNA, no fever so favor the latter, will add levaquin,  Likely has COPD. May need longer acting inhaler    LOS (Days) 4 A FACE TO FACE EVALUATION WAS PERFORMED  KIRSTEINS,ANDREW E 09/29/2013 6:50 AM

## 2013-09-29 NOTE — Progress Notes (Addendum)
Speech Language Pathology Daily Session Note  Patient Details  Name: Jay Wise MRN: [id number] Date of Birth: Aug 02, 1960  Today's Date: 09/29/2013 SLP Individual Time: 1350-1450 SLP Individual Time Calculation (min): 60 min  Short Term Goals: Week 1: SLP Short Term Goal 1 (Week 1): Patient will utilize word finding strategies to express needs and wants with Mod clinician cues SLP Short Term Goal 2 (Week 1): Patient will follow multiple step commands during functional tasks with Min clinician cues SLP Short Term Goal 3 (Week 1): Patient will increase reading comprehension accuracy at the paragraph level with with Mod clinician cues SLP Short Term Goal 4 (Week 1): Patient will complete assessment of written expression  Skilled Therapeutic Interventions:  Pt was seen for skilled speech therapy targeting cognitive-linguistic goals.  Upon arrival, pt was upright in recliner, awake, alert, and agreeable to participate in ST.  SLP facilitated the session with convergent naming tasks targeting functional word finding in a structured context with pt requiring overall min assist semantic cues to complete tasks for approximately 80% accuracy.  Pt was also >75% accurate for generative naming with min assist cuing for awareness of verbal perseveration.  During less structured functional conversations with SLP, pt was noted with more fluent output and benefited from supervision question cues for periods of anomia.  Continue per current plan of care.    FIM:  Comprehension Comprehension Mode: Auditory Comprehension: 5-Understands basic 90% of the time/requires cueing < 10% of the time Expression Expression Mode: Verbal Expression: 4-Expresses basic 75 - 89% of the time/requires cueing 10 - 24% of the time. Needs helper to occlude trach/needs to repeat words. Social Interaction Social Interaction: 5-Interacts appropriately 90% of the time - Needs monitoring or encouragement for participation or  interaction. Problem Solving Problem Solving: 4-Solves basic 75 - 89% of the time/requires cueing 10 - 24% of the time Memory Memory: 3-Recognizes or recalls 50 - 74% of the time/requires cueing 25 - 49% of the time FIM - Eating Eating Activity: 6: More than reasonable amount of time  Pain Pain Assessment Pain Assessment: No/denies pain  Therapy/Group: Individual Therapy  [name], M.A. CCC-SLP  Darrius Montano, [name] 09/29/2013, 4:47 PM

## 2013-09-30 ENCOUNTER — Inpatient Hospital Stay (HOSPITAL_COMMUNITY): Payer: PRIVATE HEALTH INSURANCE | Admitting: Speech Pathology

## 2013-09-30 ENCOUNTER — Encounter (HOSPITAL_COMMUNITY): Payer: PRIVATE HEALTH INSURANCE | Admitting: Occupational Therapy

## 2013-09-30 ENCOUNTER — Inpatient Hospital Stay (HOSPITAL_COMMUNITY): Payer: PRIVATE HEALTH INSURANCE

## 2013-09-30 LAB — CBC WITH DIFFERENTIAL/PLATELET
Basophils Absolute: 0 10*3/uL (ref 0.0–0.1)
Basophils Relative: 1 % (ref 0–1)
Eosinophils Absolute: 0.1 10*3/uL (ref 0.0–0.7)
Eosinophils Relative: 2 % (ref 0–5)
HCT: 45.8 % (ref 39.0–52.0)
Hemoglobin: 15.5 g/dL (ref 13.0–17.0)
Lymphocytes Relative: 19 % (ref 12–46)
Lymphs Abs: 1 10*3/uL (ref 0.7–4.0)
MCH: 31.7 pg (ref 26.0–34.0)
MCHC: 33.8 g/dL (ref 30.0–36.0)
MCV: 93.7 fL (ref 78.0–100.0)
Monocytes Absolute: 0.5 10*3/uL (ref 0.1–1.0)
Monocytes Relative: 9 % (ref 3–12)
NEUTROS ABS: 3.8 10*3/uL (ref 1.7–7.7)
NEUTROS PCT: 69 % (ref 43–77)
PLATELETS: 127 10*3/uL — AB (ref 150–400)
RBC: 4.89 MIL/uL (ref 4.22–5.81)
RDW: 13.7 % (ref 11.5–15.5)
WBC: 5.5 10*3/uL (ref 4.0–10.5)

## 2013-09-30 LAB — CATECHOLAMINES, FRACTIONATED, PLASMA
EPINEPHRINE: 172 pg/mL
Norepinephrine: 858 pg/mL
Total Catecholamines (Nor+Epi): 1030 pg/mL

## 2013-09-30 MED ORDER — HYDROCHLOROTHIAZIDE 10 MG/ML ORAL SUSPENSION
6.2500 mg | Freq: Every day | ORAL | Status: DC
  Administered 2013-09-30 – 2013-10-05 (×6): 6.25 mg via ORAL
  Filled 2013-09-30 (×8): qty 1.25

## 2013-09-30 MED ORDER — NICOTINE 14 MG/24HR TD PT24
14.0000 mg | MEDICATED_PATCH | Freq: Every day | TRANSDERMAL | Status: DC
  Administered 2013-09-30 – 2013-10-07 (×8): 14 mg via TRANSDERMAL
  Filled 2013-09-30 (×10): qty 1

## 2013-09-30 MED ORDER — HYDROCERIN EX CREA
TOPICAL_CREAM | Freq: Two times a day (BID) | CUTANEOUS | Status: DC
  Administered 2013-09-30 – 2013-10-04 (×8): via TOPICAL
  Administered 2013-10-05: 1 via TOPICAL
  Administered 2013-10-05 – 2013-10-07 (×4): via TOPICAL
  Filled 2013-09-30 (×2): qty 113

## 2013-09-30 MED ORDER — LABETALOL HCL 200 MG PO TABS
200.0000 mg | ORAL_TABLET | Freq: Three times a day (TID) | ORAL | Status: DC
  Administered 2013-09-30 – 2013-10-05 (×18): 200 mg via ORAL
  Filled 2013-09-30 (×22): qty 1

## 2013-09-30 NOTE — Patient Care Conference (Signed)
Inpatient RehabilitationTeam Conference and Plan of Care Update Date: 09/30/2013   Time: 11;00 AM    Patient Name: Jay Wise      Medical Record Number: [id number]  Date of Birth: [birth year] Sex: Male         Room/Bed: 4M03C/4M03C-01 Payor Info: Payor: MEDCOST / Plan: MEDCOST / Product Type: *No Product type* /    Admitting Diagnosis: ICH  Admit Date/Time:  09/25/2013  6:12 PM Admission Comments: No comment available   Primary Diagnosis:  HTN (hypertension), malignant Principal Problem: HTN (hypertension), malignant  Patient Active Problem List   Diagnosis Date Noted  . Tobacco use disorder 09/29/2013  . Dehydration 09/29/2013  . Thrombocytopenia, unspecified 09/29/2013  . HTN (hypertension), malignant 09/25/2013  . CVA (cerebral infarction) 09/25/2013  . Stroke due to intracerebral hemorrhage 09/22/2013    Expected Discharge Date: Expected Discharge Date: 10/07/13  Team Members Present: Physician leading conference: Dr. Claudette Laws Social Worker Present: Amada Jupiter, LCSW Nurse Present: Carmie End, RN PT Present: Jorje Guild, PT;Caroline Adriana Simas, PT OT Present: Rosalio Loud, OT SLP Present: [name], SLP PPS Coordinator present : Tora Duck, RN, CRRN     Current Status/Progress Goal Weekly Team Focus  Medical   Difficult to manage high blood pressure, newly diagnosed chronic bronchitis from long time tobacco  Control blood pressure by the time of discharge  Medication adjustment   Bowel/Bladder   Incontinent bowel x1, Urgency of bladder  Min Assist  Continent of bowel and bladder with min A   Swallow/Nutrition/ Hydration     WFL        ADL's   steady to min A transfers, close supervision self care, RUE strength improving rapidly in shoulder (grasp and lateral pinch WFL)  supervision  R shoulder strengthening, RUE coordination and awareness, dynamic balance, ADLs, pt/family education   Mobility   min assist transfers, gait x 150', up/down 5 steps  supervision  overall, including 12 steps  neuro re-ed, awareness, balance, activity tolerance, functional mobility   Communication   overall mod assist to indicate more complex needs/wants, but can be min assist for basic information   min assist   carryover of strategies at the conversational level    Safety/Cognition/ Behavioral Observations  min assist for impulsivity   supervision   memory, executive function   Pain   c/o mild headache,  Tylenol PRN for mild pain  <3 on a 0-10 scale  Assess pain q shift and reassess after each pain medication intervention   Skin   Small skin tear to R elbow and R shin cover with gauze and bandaid  Remain free from breakdown while on rehab.  Assess skin q shift for breakdown      *See Care Plan and progress notes for long and short-term goals.  Barriers to Discharge: See above    Possible Resolutions to Barriers:  See above    Discharge Planning/Teaching Needs:  Home with wife who may take a FMLA, depending upon pt's progress and needs at discharge      Team Discussion:  C-Diff negative taken off precautions.  Good return in strength.  Needs to change lifestyle habits-ie smoking. Poor po intake-monitoring.  Over-all goals of supervision level-due to safety awareness poor.  Revisions to Treatment Plan:  none   Continued Need for Acute Rehabilitation Level of Care: The patient requires daily medical management by a physician with specialized training in physical medicine and rehabilitation for the following conditions: Daily direction of a multidisciplinary physical rehabilitation  program to ensure safe treatment while eliciting the highest outcome that is of practical value to the patient.: Yes Daily medical management of patient stability for increased activity during participation in an intensive rehabilitation regime.: Yes Daily analysis of laboratory values and/or radiology reports with any subsequent need for medication adjustment of medical  intervention for : Neurological problems;Other;Pulmonary problems  Lucy Chris 10/01/2013, 8:40 AM

## 2013-09-30 NOTE — Progress Notes (Addendum)
[place] PHYSICAL MEDICINE & REHABILITATION     PROGRESS NOTE    Subjective/Complaints: Slept well, remains aphasic , coughed some last noc A  review of systems has been performed and if not noted above is otherwise negative.   Objective: Vital Signs: Blood pressure 163/85, pulse 58, temperature 97.5 F (36.4 C), temperature source Oral, resp. rate 18, height 6' 1"  (1.854 m), weight 87.408 kg (192 lb 11.2 oz), SpO2 97.00%. No results found.  Recent Labs  09/28/13 0530  WBC 5.9  HGB 15.0  HCT 44.2  PLT 123*    Recent Labs  09/28/13 0530  NA 138  K 4.0  CL 104  GLUCOSE 107*  BUN 29*  CREATININE 1.50*  CALCIUM 9.1   CBG (last 3)  No results found for this basename: GLUCAP,  in the last 72 hours  Wt Readings from Last 3 Encounters:  09/25/13 87.408 kg (192 lb 11.2 oz)  09/23/13 89.2 kg (196 lb 10.4 oz)    Physical Exam:  Constitutional: He appears well-developed.  Mouth: dentition fair, oral mucosa pink/moist  Eyes: EOM are normal.  Neck: Normal range of motion. Neck supple. No thyromegaly present.  Cardiovascular: Normal rate and regular rhythm. No murmur or rubs  Respiratory: Diffuse wheezing noted and generally diminished air movement. No respiratory distress.  GI: Soft. Bowel sounds are normal. He exhibits no distension. Non-tender Neurological: He is alert.  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   RUE: 4- to 4/5 shoulder, deltoid, bicept, tricep, grip, +PD. RLE: 4/5 HF, KE, ADF/APF---some apraxia with motor efforts. Sensation diminished slightly to LT and PP. DTR's 1+.  Psych: pleasant and cooperative.  Skin: Skin is warm and dry. A few scattered abrasions   Assessment/Plan: 1. 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 Physiatrist is providing close team supervision and 24 hour management of active medical problems  listed below. Physiatrist and rehab team continue to assess barriers to discharge/monitor patient progress toward functional and medical goals. Team conference today please see physician documentation under team conference tab, met with team face-to-face to discuss problems,progress, and goals. Formulized individual treatment plan based on medical history, underlying problem and comorbidities. FIM: FIM - Bathing Bathing Steps Patient Completed: Chest;Right Arm;Left Arm;Abdomen;Front perineal area;Buttocks;Right upper leg;Left upper leg;Right lower leg (including foot);Left lower leg (including foot) Bathing: 4: Steadying assist  FIM - Upper Body Dressing/Undressing Upper body dressing/undressing steps patient completed: Thread/unthread right sleeve of pullover shirt/dresss;Thread/unthread left sleeve of pullover shirt/dress;Put head through opening of pull over shirt/dress;Pull shirt over trunk Upper body dressing/undressing: 5: Set-up assist to: Obtain clothing/put away FIM - Lower Body Dressing/Undressing Lower body dressing/undressing steps patient completed: Thread/unthread right pants leg;Thread/unthread left pants leg;Pull pants up/down Lower body dressing/undressing: 3: Mod-Patient completed 50-74% of tasks  FIM - Toileting Toileting steps completed by patient: Performs perineal hygiene Toileting Assistive Devices: Grab bar or rail for support Toileting: 2: Max-Patient completed 1 of 3 steps  FIM - Radio producer Devices: Grab bars Toilet Transfers: 3-To toilet/BSC: Mod A (lift or lower assist);3-From toilet/BSC: Mod A (lift or lower assist)  FIM - Engineer, site Assistive Devices: Arm rests Bed/Chair Transfer: 4: Chair or W/C > Bed: Min A (steadying Pt. > 75%);4: Bed > Chair or W/C: Min A (steadying Pt. > 75%)  FIM - Locomotion: Wheelchair Distance: 20 Locomotion: Wheelchair: 0: Activity did not occur FIM - Locomotion:  Ambulation Locomotion:  Ambulation Assistive Devices: [place] (and without AD) Ambulation/Gait Assistance: 4: Min assist Locomotion: Ambulation: 2: Travels 50 - 149 ft with minimal assistance (Pt.>75%)  Comprehension Comprehension Mode: Auditory Comprehension: 5-Understands basic 90% of the time/requires cueing < 10% of the time  Expression Expression Mode: Verbal Expression: 4-Expresses basic 75 - 89% of the time/requires cueing 10 - 24% of the time. Needs helper to occlude trach/needs to repeat words.  Social Interaction Social Interaction: 5-Interacts appropriately 90% of the time - Needs monitoring or encouragement for participation or interaction.  Problem Solving Problem Solving Mode: Asleep Problem Solving: 4-Solves basic 75 - 89% of the time/requires cueing 10 - 24% of the time  Memory Memory Mode: Asleep Memory: 3-Recognizes or recalls 50 - 74% of the time/requires cueing 25 - 49% of the time  Medical Problem List and Plan:  1. 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 DVT Prophylaxis/Anticoagulation: Pharmaceutical: Lovenox  3. Pain Management: Tylenol prn  4. Mood: Team to provide ego support. LCSW to follow for evaluation and support.  5. Neuropsych: This patient is capable of making decisions on his own behalf.  6. Skin/Wound Care: Routine pressure relief measures. Patient able to reposition himself without difficulty.  7. HTN: Monitor every 8 hours. Continue Norvasc, lisinopril , apresoline and labetalol.difficult to manage add low dose HCTZ  -bp's 150-160, adjust meds, monitor  - HR in 50-60s reduce normodyne 8. Tobacco abuse: 2 PPD smoker.  nicotine patch 16m . Albuterol inhaler prn.  CXR findings of bronchitis +/- PNA, no fever so favor the latter, will add levaquin,  Likely has COPD. May need longer acting inhaler    LOS (Days) 5 A FACE TO FACE EVALUATION WAS PERFORMED  Tyresha Fede E 09/30/2013 7:08 AM

## 2013-09-30 NOTE — Progress Notes (Signed)
Speech Language Pathology Daily Session Note  Patient Details  Name: Jay Wise MRN: [id number] Date of Birth: 1960-11-10  Today's Date: 09/30/2013 SLP Individual Time: 0800-0830 SLP Individual Time Calculation (min): 30 min  Short Term Goals: Week 1: SLP Short Term Goal 1 (Week 1): Patient will utilize word finding strategies to express needs and wants with Mod clinician cues SLP Short Term Goal 2 (Week 1): Patient will follow multiple step commands during functional tasks with Min clinician cues SLP Short Term Goal 3 (Week 1): Patient will increase reading comprehension accuracy at the paragraph level with with Mod clinician cues SLP Short Term Goal 4 (Week 1): Patient will complete assessment of written expression  Skilled Therapeutic Interventions:  Pt was seen for skilled speech therapy targeting cognitive-linguistic goals.  Upon arrival, pt was upright in recliner, asleep, but easily awakened to voice.  SLP facilitated the session with a structured convergent naming task targeting use of description as a compensatory strategy for word finding to improve functional communication.  Pt benefited from mod assist question cues to complete task for ~75% accuracy.  SLP reviewed and reinforced education related to compensatory strategies for word finding with the use of written aid to facilitate carryover with skilled and unskilled communication partners.  Continue per current plan of care.   FIM:  Comprehension Comprehension Mode: Auditory Comprehension: 5-Understands basic 90% of the time/requires cueing < 10% of the time Expression Expression Mode: Verbal Expression: 4-Expresses basic 75 - 89% of the time/requires cueing 10 - 24% of the time. Needs helper to occlude trach/needs to repeat words. Social Interaction Social Interaction: 4-Interacts appropriately 75 - 89% of the time - Needs redirection for appropriate language or to initiate interaction. Problem Solving Problem Solving:  3-Solves basic 50 - 74% of the time/requires cueing 25 - 49% of the time Memory Memory: 3-Recognizes or recalls 50 - 74% of the time/requires cueing 25 - 49% of the time  Pain Pain Assessment Pain Assessment: No/denies pain  Therapy/Group: Individual Therapy  Jay Wise, M.A. CCC-SLP  Jay Wise, [name] 09/30/2013, 10:54 AM

## 2013-09-30 NOTE — Progress Notes (Signed)
Occupational Therapy Session Note  Patient Details  Name: Jay Wise MRN: [id number] Date of Birth: Dec 07, 1960  Today's Date: 09/30/2013 OT Individual Time: 0906-1005 OT Individual Time Calculation (min): 59 min    Short Term Goals: Week 1:  OT Short Term Goal 1 (Week 1): Pt will attend to R side visual field to prevent injury during functional mobility and to locate ADL items with Min verbal cues. OT Short Term Goal 2 (Week 1): Pt will perform shower transfer with Min A in order to increase I in functional transfers. OT Short Term Goal 3 (Week 1): Pt will perform toileting with Min A in order to increase I in self care. OT Short Term Goal 4 (Week 1): Pt will perform toilet transfer with Min A in order to increase I in functional mobility. OT Short Term Goal 5 (Week 1): Pt will perform 10 minutes of dynamic standing balance activity with min A for balance in order to increase balance strategies for occupational performance.   Skilled Therapeutic Interventions/Progress Updates:    Pt seen for BADL retraining of B/D at shower level with a focus on safety awareness with ambulation and transfers and use of RUE. Pt only required steady A to walk to bathroom but needed mod cues for safe transition to sit on tub bench and to sit down in chair.  In shower, he actively used R hand for bathing and was able to tie his shoes without difficulty. Pt taken to gym where he worked sitting at edge of mat on R shoulder strengthening exercises. His R grip is now 95 Lbs and lateral pinch 22 lbs (same as on the L).  Standing  Balance exercises with advance R foot forward and back with facilitation through torso.  Lateral leans with and without knee flexion to increase proprioceptive awareness of RLE. Pt resting on mat at end of session as his next PT session was starting momentarily.  Therapy Documentation Precautions:  Precautions Precautions: Fall Precaution Comments: reduced sensation RLE Restrictions Weight  Bearing Restrictions: No   Pain: Pain Assessment Pain Assessment: No/denies pain ADL:  See FIM for current functional status  Therapy/Group: Individual Therapy  Desaree Downen 09/30/2013, 11:45 AM

## 2013-09-30 NOTE — Progress Notes (Signed)
Recreational Therapy Session Note  Patient Details  Name: Jay Wise MRN: [id number] Date of Birth: November 06, 1960 Today's Date: 09/30/2013  Pain: no c/o Skilled Therapeutic Interventions/Progress Updates: Session focused on activity tolerance, ambulation with RW & overall safety.  Pt ambulated with RW >150' with min assist.  Pt required min instructional cues for safety with mobility & going from standing to seated position.  Pt sat w/c level for animal assisted therapy scanning to the right & petting dog with RUE.  Therapy/Group: Individual Therapy   Siddarth Hsiung 09/30/2013, 4:48 PM

## 2013-09-30 NOTE — Progress Notes (Signed)
Physical Therapy Session Note  Patient Details  Name: Jay Wise MRN: [id number] Date of Birth: 1960/09/20  Today's Date: 09/30/2013 PT Individual Time: 1002-1028; 1345-1450 PT Individual Time Calculation (min): 26 min , 65 min  Short Term Goals: Week 1:  PT Short Term Goal 1 (Week 1): pt will transfer with min assist consistently to R and L PT Short Term Goal 2 (Week 1): pt will propel w/c x 150' with supervision PT Short Term Goal 3 (Week 1): pt will perform gait x 150' with min assist PT Short Term Goal 4 (Week 1): pt will ascend and descend 5 steps with min assist  Skilled Therapeutic Interventions/Progress Updates:  Tx 1: neuromuscular re-education via forced use, visual feedback, VCs for: -R hip stability on NuStep at level 4 x 10 minutes -R hip stability for sidestepping R -R hip and knee flexion while ambulating x 120' min assist, kicking Yoga block  Pt returned to room, transferring to recliner with min guard, mod cues.  Quick release belt applied and all needs within reach.  Tx 2:  Gait with Rw x 150' x 2 with min guard/supervision assist.  neuromuscular re-education as above for: -wt shifting and R hip and knee stability on Kinetron in standing, 30 cm/sec x 20 cycles with bil UE support fading to no UE support  Berg balance test results discussed with pt.  Patient demonstrates increased fall risk as noted by score of 30/56 on Berg Balance Scale.  (<36= high risk for falls, close to 100%; 37-45 significant >80%; 46-51 moderate >50%; 52-55 lower >25%)  Rec. Therapist with pt at end of session.    Therapy Documentation Precautions:  Precautions Precautions: Fall Precaution Comments: reduced sensation RLE Restrictions Weight Bearing Restrictions: No   Pain: Pain Assessment Pain Assessment: No/denies pain   Locomotion : Ambulation Ambulation/Gait Assistance: 4: Min assist      See FIM for current functional status  Therapy/Group: Individual  Therapy  Anh Bigos 09/30/2013, 11:03 AM

## 2013-10-01 ENCOUNTER — Inpatient Hospital Stay (HOSPITAL_COMMUNITY): Payer: PRIVATE HEALTH INSURANCE

## 2013-10-01 ENCOUNTER — Inpatient Hospital Stay (HOSPITAL_COMMUNITY): Payer: PRIVATE HEALTH INSURANCE | Admitting: Speech Pathology

## 2013-10-01 ENCOUNTER — Inpatient Hospital Stay (HOSPITAL_COMMUNITY): Payer: PRIVATE HEALTH INSURANCE | Admitting: Occupational Therapy

## 2013-10-01 DIAGNOSIS — I10 Essential (primary) hypertension: Secondary | ICD-10-CM

## 2013-10-01 DIAGNOSIS — I619 Nontraumatic intracerebral hemorrhage, unspecified: Secondary | ICD-10-CM

## 2013-10-01 LAB — BASIC METABOLIC PANEL
ANION GAP: 13 (ref 5–15)
BUN: 27 mg/dL — ABNORMAL HIGH (ref 6–23)
CHLORIDE: 99 meq/L (ref 96–112)
CO2: 26 mEq/L (ref 19–32)
Calcium: 9.8 mg/dL (ref 8.4–10.5)
Creatinine, Ser: 1.32 mg/dL (ref 0.50–1.35)
GFR calc Af Amer: 69 mL/min — ABNORMAL LOW (ref >90)
GFR, EST NON AFRICAN AMERICAN: 59 mL/min — AB (ref >90)
Glucose, Bld: 97 mg/dL (ref 70–99)
POTASSIUM: 4.1 meq/L (ref 3.7–5.3)
Sodium: 138 mEq/L (ref 137–147)

## 2013-10-01 LAB — ALDOSTERONE + RENIN ACTIVITY W/ RATIO
ALDO / PRA Ratio: 14.8 Ratio (ref 0.9–28.9)
Aldosterone: 4 ng/dL
PRA LC/MS/MS: 0.27 ng/mL/h (ref 0.25–5.82)

## 2013-10-01 NOTE — Progress Notes (Signed)
Occupational Therapy Session Note  Patient Details  Name: Jay Wise MRN: [id number] Date of Birth: Nov 24, 1960  Today's Date: 10/01/2013 OT Individual Time: 4098-1191 OT Individual Time Calculation (min): 60 min    Short Term Goals: Week 1:  OT Short Term Goal 1 (Week 1): Pt will attend to R side visual field to prevent injury during functional mobility and to locate ADL items with Min verbal cues. OT Short Term Goal 2 (Week 1): Pt will perform shower transfer with Min A in order to increase I in functional transfers. OT Short Term Goal 3 (Week 1): Pt will perform toileting with Min A in order to increase I in self care. OT Short Term Goal 4 (Week 1): Pt will perform toilet transfer with Min A in order to increase I in functional mobility. OT Short Term Goal 5 (Week 1): Pt will perform 10 minutes of dynamic standing balance activity with min A for balance in order to increase balance strategies for occupational performance.   Skilled Therapeutic Interventions/Progress Updates:    Pt seen this session for RUE and RLE neuro re-ed to improve his balance and safety during his self care tasks. Pt declined a shower and stated that he had already dressed in fresh clothing last night. Pt was anxious to work in the gym. Pt worked on: Sit >< stand with no UE support holding dowel bar in front of body and reaching overhead with focus on eccentric control of R leg; proprioceptive awareness of R foot with stepping and placing activities; R shoulder strengthening; lunging and reaching with R leg and arm; standing and reaching to R side; step ups on stairs using BUE on rails with mod A through R knee to control descent and stepping foot up and down on step. With exercises requiring R foot to step through or up, pt required mod A through trunk to facilitate movement.  Pt was able to ambulate to and from room to gym with RW with min A. Pt returned to room, stood at sink to wash hands and sat in recliner. Pt  applied his own quick release belt.  Therapy Documentation Precautions:  Precautions Precautions: Fall Precaution Comments: reduced sensation RLE Restrictions Weight Bearing Restrictions: No    Vital Signs: Therapy Vitals Pulse Rate: 62 BP: 167/63 mmHg Oxygen Therapy O2 Device: None (Room air) Pain: Pain Assessment Pain Assessment: No/denies pain ADL:  See FIM for current functional status  Therapy/Group: Individual Therapy  Tava Peery 10/01/2013, 11:57 AM

## 2013-10-01 NOTE — Progress Notes (Signed)
Physical Therapy Session Note  Patient Details  Name: Jay Wise MRN: [id number] Date of Birth: 01/30/1960  Today's Date: 10/01/2013 PT Individual Time: 7829-5621 PT Individual Time Calculation (min): 60 min   Short Term Goals: Week 1:  PT Short Term Goal 1 (Week 1): pt will transfer with min assist consistently to R and L PT Short Term Goal 2 (Week 1): pt will propel w/c x 150' with supervision PT Short Term Goal 3 (Week 1): pt will perform gait x 150' with min assist PT Short Term Goal 4 (Week 1): pt will ascend and descend 5 steps with min assist  Skilled Therapeutic Interventions/Progress Updates:    co-treatment for part of session with Therapeutic Rec for standing balance activities: -golf putting R handed x 12 balls 80% accuracy -squatting down to retrieve balls x 10 with 1 LOB R recovered independently. -kicking soccer ball with R foot and L foot,  support at contralateral hip resting against table -soccer drill - standing on RLE with L foot balanced on top of ball ; +2 for balance  Other therapeutic activity- bil hand use to replace caps of 4 types of Rx bottles 100% accuracy Sit>< stand from recliner, min guard assist.  neuromuscular re-education via VCs, tactile cues for RLE foot clearance during gait x 100' without AD, x 150' with RW, all on level tile; x 10' on carpet with min assist due to R foot catching. As pt fatigues, ball of R foot does not clear, causing lateral whip deviation.  Returned to room; pt transferred back to recliner.  PT elevated pt's LEs and reclined back, left all needs in place, and placed quick release belt.  PT highly recommends that pt recline fully or return to bed to nap after lunch, qd.  Informed NT.  Therapy Documentation Precautions:  Precautions Precautions: Fall Precaution Comments: reduced sensation RLE Restrictions Weight Bearing Restrictions: No Pain: Pain Assessment Pain Assessment: No/denies pain   Locomotion  : Ambulation Ambulation/Gait Assistance: 4: Min assist       See FIM for current functional status  Therapy/Group: Individual Therapy  [name] 10/01/2013, 4:09 PM

## 2013-10-01 NOTE — Progress Notes (Signed)
Speech Language Pathology Daily Session Note  Patient Details  Name: Jay Wise MRN: [id number] Date of Birth: December 14, 1960  Today's Date: 10/01/2013 SLP Individual Time: 7829-5621 SLP Individual Time Calculation (min): 60 min  Short Term Goals: Week 1: SLP Short Term Goal 1 (Week 1): Patient will utilize word finding strategies to express needs and wants with Mod clinician cues SLP Short Term Goal 2 (Week 1): Patient will follow multiple step commands during functional tasks with Min clinician cues SLP Short Term Goal 3 (Week 1): Patient will increase reading comprehension accuracy at the paragraph level with with Mod clinician cues SLP Short Term Goal 4 (Week 1): Patient will complete assessment of written expression  Skilled Therapeutic Interventions:  Pt was seen for skilled speech therapy targeting cognitive-linguistic goals.  Upon arrival, pt upright in recliner, asleep but easily awakened to voice.  SLP transported pt to therapy room and facilitated session with a structured task targeting sequencing and word finding, using pictures to elicit use of word finding strategies in connected speech.  Pt required overall mod assist verbal cues for word finding and increased awareness of circumlocutions to complete task for >75% accuracy.  SLP also facilitated the session with structured picture description tasks targeting verbal reasoning and basic safety awareness with pt requiring overall min assist to identify safety issues and mod assist to verbally generate solutions to problems.  Additionally, pt was ~80% accurate with min assist for abstract reasoning to verbally compare and contrast objects in pictures at the phrase level.  Upon return to the room, SLP reviewed and reinforced safety precautions including use of call bell and quick release belt due to pt exhibiting impulsivity and getting up without asking for assistance per report.  Pt verbalized understanding and returned demonstration of  use of call bell with SLP model and mod assist instructional cues.  Continue per current plan of care.    FIM:  Comprehension Comprehension Mode: Auditory Comprehension: 5-Understands basic 90% of the time/requires cueing < 10% of the time Expression Expression Mode: Verbal Expression: 4-Expresses basic 75 - 89% of the time/requires cueing 10 - 24% of the time. Needs helper to occlude trach/needs to repeat words. Social Interaction Social Interaction: 5-Interacts appropriately 90% of the time - Needs monitoring or encouragement for participation or interaction. Problem Solving Problem Solving: 3-Solves basic 50 - 74% of the time/requires cueing 25 - 49% of the time Memory Memory: 3-Recognizes or recalls 50 - 74% of the time/requires cueing 25 - 49% of the time  Pain Pain Assessment Pain Assessment: No/denies pain  Therapy/Group: Individual Therapy  [name], M.A. CCC-SLP  Hayven Fatima, [name] 10/01/2013, 5:40 PM

## 2013-10-01 NOTE — Progress Notes (Signed)
Recreational Therapy Session Note  Patient Details  Name: Jay Wise MRN: [id number] Date of Birth: 03/05/1960 Today's Date: 10/01/2013  Pain: no c/o Skilled Therapeutic Interventions/Progress Updates: Session focused on activity tolerance, ambulation with RW, dynamic standing balance.  Pt ambulated with RW ~150' with min assist/mod cues.  Pt stood for golf activity putting golf balls using BUE's with min assist.  Pt performed squats to pick balls up from floor with 1 LOB, but recovered independently.  Pt also stood to kick a soccer  ball with RLE and then with LLE with min assist.  Pt stood on RLE with L foot on soccer ball with +2 assist for balance.  Therapy/Group: Co-Treatment   Verbie Babic 10/01/2013, 3:26 PM

## 2013-10-01 NOTE — Progress Notes (Signed)
Social Work Patient ID: Jay Wise, male   DOB: 09-04-1960, 53 y.o.   MRN: [id number] Met with pt and wife to discuss team conference goals-supervision level and discharge 9/16.  Wife debating whether she needs to take a FMLA. She will discuss with her parents to see how much care they can provide.  Both relieved he does have C-diff.  Discussed recommendation of OP therapies And answered questions regarding disability.  Have given them pt's paperwork for his FMLA form his job.  Have set up PCP through [facility] they are to contact wife regarding Appointment.  Continue to work on discharge plans.

## 2013-10-01 NOTE — Progress Notes (Signed)
[facility] PHYSICAL MEDICINE & REHABILITATION     PROGRESS NOTE    Subjective/Complaints: Slept well, remains aphasic , denies breathing problems, denies numbness A  review of systems has been performed and if not noted above is otherwise negative.   Objective: Vital Signs: Blood pressure 160/78, pulse 66, temperature 98 F (36.7 C), temperature source Oral, resp. rate 16, height  (1.854 m), weight 87.408 kg (192 lb 11.2 oz), SpO2 96.00%. No results found.  Recent Labs  09/30/13 0647  WBC 5.5  HGB 15.5  HCT 45.8  PLT 127*   No results found for this basename: NA, K, CL, CO, GLUCOSE, BUN, CREATININE, CALCIUM,  in the last 72 hours CBG (last 3)  No results found for this basename: GLUCAP,  in the last 72 hours  Wt Readings from Last 3 Encounters:  09/25/13 87.408 kg (192 lb 11.2 oz)  09/23/13 89.2 kg (196 lb 10.4 oz)    Physical Exam:  Constitutional: He appears well-developed.  Mouth: dentition fair, oral mucosa pink/moist  Eyes: EOM are normal.  Neck: Normal range of motion. Neck supple. No thyromegaly present.  Cardiovascular: Normal rate and regular rhythm. No murmur or rubs  Respiratory: Diffuse wheezing noted and generally diminished air movement. No respiratory distress.  GI: Soft. Bowel sounds are normal. He exhibits no distension. Non-tender Neurological: He is alert.  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   RUE: 4+/5 shoulder, deltoid, bicep, tricep, grip, . RLE: 4/5 HF, KE, ADF/APF---some apraxia with motor efforts. Sensation intact to LT . DTR's 1+.  Psych: pleasant and cooperative.  Skin: Skin is warm and dry. A few scattered abrasions   Assessment/Plan: 1. 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 Physiatrist is providing close team supervision and 24 hour management of active medical problems listed  below. Physiatrist and rehab team continue to assess barriers to discharge/monitor patient progress toward functional and medical goals.  FIM: FIM - Bathing Bathing Steps Patient Completed: Chest;Right Arm;Left Arm;Abdomen;Front perineal area;Buttocks;Right upper leg;Left upper leg;Right lower leg (including foot);Left lower leg (including foot) Bathing: 5: Supervision: Safety issues/verbal cues  FIM - Upper Body Dressing/Undressing Upper body dressing/undressing steps patient completed: Thread/unthread right sleeve of pullover shirt/dresss;Thread/unthread left sleeve of pullover shirt/dress;Put head through opening of pull over shirt/dress;Pull shirt over trunk Upper body dressing/undressing: 5: Set-up assist to: Obtain clothing/put away FIM - Lower Body Dressing/Undressing Lower body dressing/undressing steps patient completed: Thread/unthread right pants leg;Thread/unthread left pants leg;Pull pants up/down;Don/Doff right sock;Don/Doff left sock;Don/Doff left shoe;Fasten/unfasten right shoe;Fasten/unfasten left shoe Lower body dressing/undressing: 5: Supervision: Safety issues/verbal cues  FIM - Toileting Toileting steps completed by patient: Performs perineal hygiene Toileting Assistive Devices: Grab bar or rail for support Toileting: 2: Max-Patient completed 1 of 3 steps  FIM - Diplomatic Services operational officer Devices: Grab bars Toilet Transfers: 3-To toilet/BSC: Mod A (lift or lower assist);3-From toilet/BSC: Mod A (lift or lower assist)  FIM - Bed/Chair Transfer Bed/Chair Transfer Assistive Devices: Arm rests Bed/Chair Transfer: 0: Activity did not occur  FIM - Locomotion: Wheelchair Distance: 20 Locomotion: Wheelchair: 0: Activity did not occur FIM - Locomotion: Ambulation Locomotion: Ambulation Assistive Devices: Designer, industrial/product Ambulation/Gait Assistance: 4: Min assist Locomotion: Ambulation: 4: Travels 150 ft or more with minimal assistance  (Pt.>75%)  Comprehension Comprehension Mode: Auditory Comprehension: 5-Understands basic 90% of the time/requires cueing < 10% of the time  Expression Expression Mode: Verbal Expression: 4-Expresses basic 75 -  89% of the time/requires cueing 10 - 24% of the time. Needs helper to occlude trach/needs to repeat words.  Social Interaction Social Interaction: 4-Interacts appropriately 75 - 89% of the time - Needs redirection for appropriate language or to initiate interaction.  Problem Solving Problem Solving Mode: Asleep Problem Solving: 3-Solves basic 50 - 74% of the time/requires cueing 25 - 49% of the time  Memory Memory Mode: Asleep Memory: 3-Recognizes or recalls 50 - 74% of the time/requires cueing 25 - 49% of the time  Medical Problem List and Plan:  1. 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 DVT Prophylaxis/Anticoagulation: Pharmaceutical: Lovenox, plt a little low, monitor  3. Pain Management: Tylenol prn  4. Mood: Team to provide ego support. LCSW to follow for evaluation and support.  5. Neuropsych: This patient is capable of making decisions on his own behalf.  6. Skin/Wound Care: Routine pressure relief measures. Patient able to reposition himself without difficulty.  7. HTN: Monitor every 8 hours. Continue Norvasc, lisinopril , apresoline and labetalol.difficult to manage add low dose HCTZ  -bp's 150-160, adjust meds, monitor  - HR in 50-60s reduce normodyne 8. Tobacco abuse: 2 PPD smoker.  nicotine patch  . Albuterol inhaler prn.  CXR findings of bronchitis +/- PNA, no fever so favor the latter, will add levaquin,  Likely has COPD. May need longer acting inhaler    LOS (Days) 6 A FACE TO FACE EVALUATION WAS PERFORMED  Erick Colace 10/01/2013 6:43 AM

## 2013-10-01 NOTE — Progress Notes (Signed)
Social Work Patient ID: Jay Wise, male   DOB: [birth year], 53 y.o.   MRN: [id number] Insurance update faxed to [employer] await further authroization

## 2013-10-02 ENCOUNTER — Inpatient Hospital Stay (HOSPITAL_COMMUNITY): Payer: PRIVATE HEALTH INSURANCE | Admitting: Speech Pathology

## 2013-10-02 ENCOUNTER — Inpatient Hospital Stay (HOSPITAL_COMMUNITY): Payer: PRIVATE HEALTH INSURANCE | Admitting: Occupational Therapy

## 2013-10-02 ENCOUNTER — Inpatient Hospital Stay (HOSPITAL_COMMUNITY): Payer: PRIVATE HEALTH INSURANCE | Admitting: *Deleted

## 2013-10-02 DIAGNOSIS — I619 Nontraumatic intracerebral hemorrhage, unspecified: Secondary | ICD-10-CM

## 2013-10-02 DIAGNOSIS — I10 Essential (primary) hypertension: Secondary | ICD-10-CM

## 2013-10-02 DIAGNOSIS — R0989 Other specified symptoms and signs involving the circulatory and respiratory systems: Secondary | ICD-10-CM

## 2013-10-02 MED ORDER — IPRATROPIUM-ALBUTEROL 0.5-2.5 (3) MG/3ML IN SOLN
3.0000 mL | Freq: Two times a day (BID) | RESPIRATORY_TRACT | Status: DC
  Administered 2013-10-02: 3 mL via RESPIRATORY_TRACT
  Filled 2013-10-02: qty 3

## 2013-10-02 NOTE — Progress Notes (Signed)
Physical Therapy Weekly Progress Note  Patient Details  Name: Jay Wise MRN: [id number] Date of Birth: 05/21/1960  Beginning of progress report period: September 25, 2013 End of progress report period: October 02, 2013  Today's Date: 10/02/2013  Patient has met 3 of 4 short term goals. WC goal not met due to no longer focus of tx.  Pt has progressed to requiring Min A overall at this time for safety and balance, including gait and transfers. Berg balance score 30/56 indicates high falls risk. Tx currently focusing on neuro re-education and exploring bracing options for optimal gait safety.    Patient continues to demonstrate the following deficits: decreased coordination, timing and imbalanced motor activation and therefore will continue to benefit from skilled PT intervention to enhance overall performance with balance, functional use of  right lower extremity, coordination and knowledge of precautions.  Patient progressing toward long term goals..  Continue plan of care.  PT Short Term Goals Week 1:  PT Short Term Goal 1 (Week 1): pt will transfer with min assist consistently to R and L PT Short Term Goal 1 - Progress (Week 1): Met PT Short Term Goal 2 (Week 1): pt will propel w/c x 150' with supervision PT Short Term Goal 2 - Progress (Week 1): Partly met PT Short Term Goal 3 (Week 1): pt will perform gait x 150' with min assist PT Short Term Goal 3 - Progress (Week 1): Met PT Short Term Goal 4 (Week 1): pt will ascend and descend 5 steps with min assist PT Short Term Goal 4 - Progress (Week 1): Met Week 2:  PT Short Term Goal 1 (Week 2): STG=LTG due to LOS, S overall   Therapy Documentation Precautions:  Precautions Precautions: Fall Precaution Comments: reduced sensation RLE Restrictions Weight Bearing Restrictions: No General:      See FIM for current functional status  Kennieth Rad, PT, DPT  10/02/2013, 3:23 PM

## 2013-10-02 NOTE — Progress Notes (Signed)
Speech Language Pathology Daily Session Note  Patient Details  Name: Jay Wise MRN: [id number] Date of Birth: 1960/09/29  Today's Date: 10/02/2013 SLP Individual Time: 2952-8413; 1300-1330 SLP Individual Time Calculation (min): 30 min, 30 min  Short Term Goals: Week 1: SLP Short Term Goal 1 (Week 1): Patient will utilize word finding strategies to express needs and wants with Mod clinician cues SLP Short Term Goal 2 (Week 1): Patient will follow multiple step commands during functional tasks with Min clinician cues SLP Short Term Goal 3 (Week 1): Patient will increase reading comprehension accuracy at the paragraph level with with Mod clinician cues SLP Short Term Goal 4 (Week 1): Patient will complete assessment of written expression  Skilled Therapeutic Interventions:  Session 1: Pt was seen for skilled speech therapy targeting linguistic goals.  Upon arrival, pt was upright in recliner, asleep, but awakened to voice and agreeable to participate in therapy.   SLP facilitated the session with a structured generative naming task targeting word finding with pt requiring overall mod assist to complete task for ~75% accuracy.  Pt demonstrated most notable improvements with use of phonemic and semantic cues for word finding.    Session 2:  Pt was seen for skilled speech therapy targeting cognitive goals.  Upon arrival, pt was upright in recliner, asleep, but awakened to voice and agreeable to participate in therapy.  SLP facilitated the session with a structured reasoning task targeting planning, thought organization and mental flexibility.  During task, pt required mod-max assist multimodal cues to use note taking as an external aid to recall at least 2-3 details from a hypothetical narrative.  Pt then used notesto complete functional problem solving tasks related to narrative with min assist.  Pt also completed basic, functional math calculations related to narrative with mod-max assist for  organization and numerical reasoning.    FIM:  Comprehension Comprehension Mode: Auditory Comprehension: 5-Understands basic 90% of the time/requires cueing < 10% of the time Expression Expression Mode: Verbal Expression: 5-Expresses basic 90% of the time/requires cueing < 10% of the time. Social Interaction Social Interaction: 5-Interacts appropriately 90% of the time - Needs monitoring or encouragement for participation or interaction. Problem Solving Problem Solving: 3-Solves basic 50 - 74% of the time/requires cueing 25 - 49% of the time Memory Memory: 3-Recognizes or recalls 50 - 74% of the time/requires cueing 25 - 49% of the time FIM - Eating Eating Activity: 6: More than reasonable amount of time  Pain Pain Assessment Pain Assessment: No/denies pain  Therapy/Group: Individual Therapy  [name], M.A. CCC-SLP  Kaylean Tupou, [name] 10/02/2013, 4:31 PM

## 2013-10-02 NOTE — Progress Notes (Signed)
Physical Therapy Session Note  Patient Details  Name: Jay Wise MRN: [id number] Date of Birth: February 26, 1960  Today's Date: 10/02/2013 PT Individual Time: 1400-1430 PT Individual Time Calculation (min): 30 min   Short Term Goals: Week 2:  PT Short Term Goal 1 (Week 2): STG=LTG due to LOS, S overall PT Short Term Goal 4 (Week 2): Pt will   Skilled Therapeutic Interventions/Progress Updates:    Patient received sitting in wheelchair. Session focused on gait training trials with R toe off AFO vs. Posterior AFO. Patient performed gait training with RW x100' each AFO with min guard. Each AFO assists with DF and foot clearance, but patient's R foot remains supinated with use of either AFO. Patient with more episodes of poor foot clearance with posterior AFO, however, would not determine that either is better than the other. Wheelchair mobility >150' with supervision and use of B LEs back to room. Patient left sitting in recliner with all needs within reach and seatbelt donned.  Therapy Documentation Precautions:  Precautions Precautions: Fall Precaution Comments: reduced sensation RLE Restrictions Weight Bearing Restrictions: No Vital Signs: Pain: Pain Assessment Pain Assessment: No/denies pain Pain Score: 0-No pain Locomotion : Ambulation Ambulation/Gait Assistance: 4: Min assist;4: Min guard Wheelchair Mobility Distance: 150   See FIM for current functional status  Therapy/Group: Individual Therapy  Chipper Herb. Bryar Dahms, PT, DPT 10/02/2013, 5:01 PM

## 2013-10-02 NOTE — Progress Notes (Signed)
VASCULAR LAB PRELIMINARY  ARTERIAL  ABI completed:    RIGHT    LEFT    PRESSURE WAVEFORM  PRESSURE WAVEFORM  BRACHIAL 172 Triphasic BRACHIAL 169 Monophasic  DP 128 Dampened monophasic DP 92 Dampened monophasic  AT   AT    PT 114 Dampened monophasic PT 126 Dampened monophasic  PER   PER    GREAT TOE  NA GREAT TOE  NA    RIGHT LEFT  ABI 0.74 0.73   Findings suggest moderate arterial insufficiency bilaterally.   10/02/2013 5:59 PM Gertie Fey, RVT, RDCS, RDMS

## 2013-10-02 NOTE — Progress Notes (Signed)
Occupational Therapy Weekly Progress Note  Patient Details  Name: Jay Wise MRN: [id number] Date of Birth: 1960-05-23  Beginning of progress report period: September 26, 2013 End of progress report period: October 02, 2013  Today's Date: 10/02/2013 OT Individual Time: 0800-0900 OT Individual Time Calculation (min): 60 min    Patient has met 1 of 5 short term goals.  Pt has made excellent progress with use of his RUE, R side awareness, balance. He continues to have difficulty with endurance, therefore standing for a full 10 minutes is taxing for him. He is continuing to work on his endurance.  Patient continues to demonstrate the following deficits: decreased R shoulder and RLE strength, dynamic balance, cognitive deficits of memory and processing and therefore will continue to benefit from skilled OT intervention to enhance overall performance with BADL and iADL.  Patient progressing toward long term goals..  Continue plan of care.  OT Short Term Goals Week 1:  OT Short Term Goal 1 (Week 1): Pt will attend to R side visual field to prevent injury during functional mobility and to locate ADL items with Min verbal cues. OT Short Term Goal 1 - Progress (Week 1): Met OT Short Term Goal 2 (Week 1): Pt will perform shower transfer with Min A in order to increase I in functional transfers. OT Short Term Goal 2 - Progress (Week 1): Met OT Short Term Goal 3 (Week 1): Pt will perform toileting with Min A in order to increase I in self care. OT Short Term Goal 3 - Progress (Week 1): Met OT Short Term Goal 4 (Week 1): Pt will perform toilet transfer with Min A in order to increase I in functional mobility. OT Short Term Goal 4 - Progress (Week 1): Met OT Short Term Goal 5 (Week 1): Pt will perform 10 minutes of dynamic standing balance activity with min A for balance in order to increase balance strategies for occupational performance.  OT Short Term Goal 5 - Progress (Week 1): Progressing  toward goal Week 2:  OT Short Term Goal 1 (Week 2): STGs = LTGs due to discharge in less than 1 week  Skilled Therapeutic Interventions/Progress Updates:    Pt seen for BADL retraining of bathing at shower level and dressing with a focus on dynamic balance and safe transfers. Pt walked into bathroom and undressed from tub bench. Steady A to walk and complete transfers with RW as pt continues to catch R foot occasionally. He completed shower and dressing very quickly. He then ambulated to gym to work on R shoulder strength and [facility]. Pt used arm cycle at a resistance of 5 for 10 minutes with 3 short breaks.  Pt did state the exercise was very fatiguing. He then sat edge of mat to work on R [facility] with assembling very small pieces of perdue pegboard. Pt did well with the activity in that he assembled pieces quickly and did not drop any.  He then worked on step ups onto a 5 inch step placing R foot on and off step holding onto B rails 20x and then tap ups with R foot on step and L foot tapping up and down focusing on eccentric control of R quad 20x. Pt needs mod A  At knee to stabilize on the descent.   He then ambulated back to his room and donned his Quick release belt. Pt with call light in reach.    Continue OT 5-7 days a week 1-2x a day for 60-90 minutes  with Balance/vestibular training;Cognitive remediation/compensation;Discharge planning;DME/adaptive equipment instruction;Functional mobility training;Psychosocial support;Therapeutic Activities;UE/LE Strength taining/ROM;Visual/perceptual remediation/compensation;UE/LE Coordination activities;Therapeutic Exercise;Self Care/advanced ADL retraining;Patient/family education;Neuromuscular re-education to maximize his independence with self care.  Therapy Documentation Precautions:  Precautions Precautions: Fall Precaution Comments: reduced sensation RLE Restrictions Weight Bearing Restrictions: No    Vital Signs: Oxygen Therapy SpO2: 98 % O2 Device:  None (Room air) Pain: Pain Assessment Pain Assessment: No/denies pain  ADL:  See FIM for current functional status  Therapy/Group: Individual Therapy  SAGUIER,JULIA 10/02/2013, 11:06 AM

## 2013-10-02 NOTE — Progress Notes (Signed)
Physical Therapy Session Note  Patient Details  Name: Jay Wise MRN: [id number] Date of Birth: [birth year]  Today's Date: 10/02/2013 PT Individual Time: 1330-1400 PT Individual Time Calculation (min): 30 min   Short Term Goals: Week 2:  PT Short Term Goal 1 (Week 2): STG=LTG due to LOS, S overall  Skilled Therapeutic Interventions/Progress Updates:  1:1. Pt received sitting in recliner working with SLP, PT taking over. Focus this session on NMR and gait training. Pt propelled w/c 175' w/ B LE for emphasis on reciprocal motion and R foot clearance. Pt practiced negotiation up/down 5 steps x2 w/ B rail, 1x with emphasis on utilizing correct step-to pattern for safety and 2x with encouraged reciprocal pattern to target R hip extension and quad control. Pt amb 150'x1 w/ L HHA req mod-max A for balance while kicking yoga block initially with R LE then progressing to alternated B LE for emphasis on R hip flexion, emergent awareness of R LE and R SLS. Initiated gait training w/ trial AFO's, Perlie Mayo, PT, DPT taking over tx session, see her note for complete details regarding AFO trials.   Therapy Documentation Precautions:  Precautions Precautions: Fall Precaution Comments: reduced sensation RLE Restrictions Weight Bearing Restrictions: No  See FIM for current functional status  Therapy/Group: Individual Therapy  Denzil Hughes 10/02/2013, 5:21 PM

## 2013-10-02 NOTE — Progress Notes (Signed)
[place] PHYSICAL MEDICINE & REHABILITATION     PROGRESS NOTE    Subjective/Complaints: No new issues overnite. Breathing OK A  review of systems has been performed and if not noted above is otherwise negative.   Objective: Vital Signs: Blood pressure 178/88, pulse 58, temperature 97.4 F (36.3 C), temperature source Oral, resp. rate 18, height  (1.854 m), weight 83.1 kg (183 lb 3.2 oz), SpO2 99.00%. No results found.  Recent Labs  09/30/13 0647  WBC 5.5  HGB 15.5  HCT 45.8  PLT 127*    Recent Labs  10/01/13 0717  NA 138  K 4.1  CL 99  GLUCOSE 97  BUN 27*  CREATININE 1.32  CALCIUM 9.8   CBG (last 3)  No results found for this basename: GLUCAP,  in the last 72 hours  Wt Readings from Last 3 Encounters:  10/01/13 83.1 kg (183 lb 3.2 oz)  09/23/13 89.2 kg (196 lb 10.4 oz)    Physical Exam:  Constitutional: He appears well-developed.  Mouth: dentition fair, oral mucosa pink/moist  Eyes: EOM are normal.  Neck: Normal range of motion. Neck supple. No thyromegaly present.  Cardiovascular: Normal rate and regular rhythm. No murmur or rubs  Respiratory: Diffuse wheezing noted and generally diminished air movement. No respiratory distress.  GI: Soft. Bowel sounds are normal. He exhibits no distension. Non-tender Neurological: He is alert.  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   RUE: 4+/5 shoulder, deltoid, bicep, tricep, grip, . RLE: 4/5 HF, KE, ADF/APF---some apraxia with motor efforts. Sensation intact to LT . DTR's 1+.  Psych: pleasant and cooperative.  Skin: Skin is warm and dry. A few scattered abrasions   Assessment/Plan: 1. 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 Physiatrist is providing close team supervision and 24 hour management of active medical problems listed below. Physiatrist and rehab team continue  to assess barriers to discharge/monitor patient progress toward functional and medical goals.  FIM: FIM - Bathing Bathing Steps Patient Completed: Chest;Right Arm;Left Arm;Abdomen;Front perineal area;Buttocks;Right upper leg;Left upper leg;Right lower leg (including foot);Left lower leg (including foot) Bathing: 5: Supervision: Safety issues/verbal cues  FIM - Upper Body Dressing/Undressing Upper body dressing/undressing steps patient completed: Thread/unthread right sleeve of pullover shirt/dresss;Thread/unthread left sleeve of pullover shirt/dress;Put head through opening of pull over shirt/dress;Pull shirt over trunk Upper body dressing/undressing: 5: Set-up assist to: Obtain clothing/put away FIM - Lower Body Dressing/Undressing Lower body dressing/undressing steps patient completed: Thread/unthread right pants leg;Thread/unthread left pants leg;Pull pants up/down;Don/Doff right sock;Don/Doff left sock;Don/Doff left shoe;Fasten/unfasten right shoe;Fasten/unfasten left shoe Lower body dressing/undressing: 5: Supervision: Safety issues/verbal cues  FIM - Toileting Toileting steps completed by patient: Performs perineal hygiene Toileting Assistive Devices: Grab bar or rail for support Toileting: 2: Max-Patient completed 1 of 3 steps  FIM - Diplomatic Services operational officer Devices: Grab bars Toilet Transfers: 3-To toilet/BSC: Mod A (lift or lower assist);3-From toilet/BSC: Mod A (lift or lower assist)  FIM - Press photographer Assistive Devices: Arm rests Bed/Chair Transfer: 4: Chair or W/C > Bed: Min A (steadying Pt. > 75%);4: Sit > Supine: Min A (steadying pt. > 75%/lift 1 leg)  FIM - Locomotion: Wheelchair Distance: 20 Locomotion: Wheelchair: 2: Travels 50 - 149 ft with supervision, cueing or coaxing FIM - Locomotion: Ambulation Locomotion: Ambulation Assistive Devices: Designer, industrial/product Ambulation/Gait Assistance: 4: Min assist Locomotion: Ambulation:  4: Travels 150 ft or more with minimal  assistance (Pt.>75%)  Comprehension Comprehension Mode: Auditory Comprehension: 5-Understands basic 90% of the time/requires cueing < 10% of the time  Expression Expression Mode: Verbal Expression: 5-Expresses basic needs/ideas: With no assist  Social Interaction Social Interaction: 5-Interacts appropriately 90% of the time - Needs monitoring or encouragement for participation or interaction.  Problem Solving Problem Solving Mode: Asleep Problem Solving: 4-Solves basic 75 - 89% of the time/requires cueing 10 - 24% of the time  Memory Memory Mode: Asleep Memory: 3-Recognizes or recalls 50 - 74% of the time/requires cueing 25 - 49% of the time  Medical Problem List and Plan:  1. 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 DVT Prophylaxis/Anticoagulation: Pharmaceutical: Lovenox, plt a little low, monitor  3. Pain Management: Tylenol prn  4. Mood: Team to provide ego support. LCSW to follow for evaluation and support.  5. Neuropsych: This patient is capable of making decisions on his own behalf.  6. Skin/Wound Care: Routine pressure relief measures. Patient able to reposition himself without difficulty.  7. HTN: Monitor every 8 hours. Continue Norvasc, lisinopril , apresoline and labetalol.difficult to manage add low dose HCTZ  -bp's 150-160, adjust meds, monitor  - HR in 50-60s reduce normodyne 8. Tobacco abuse: 2 PPD smoker.  nicotine patch  . Albuterol inhaler prn.  CXR findings of bronchitis +/- PNA, no fever so favor the latter, will add levaquin,  Likely has COPD. May need longer acting inhaler    LOS (Days) 7 A FACE TO FACE EVALUATION WAS PERFORMED  KIRSTEINS,ANDREW E 10/02/2013 7:03 AM

## 2013-10-02 NOTE — Progress Notes (Signed)
Orthopedic Tech Progress Note Patient Details:  Jay Wise [birth year] [id number] Called [facility] for brace order. Patient ID: [name], male   DOB: [birth year], 53 y.o.   MRN: [id number]   [name] 10/02/2013, 4:26 PM

## 2013-10-03 ENCOUNTER — Inpatient Hospital Stay (HOSPITAL_COMMUNITY): Payer: PRIVATE HEALTH INSURANCE | Admitting: Occupational Therapy

## 2013-10-03 DIAGNOSIS — I619 Nontraumatic intracerebral hemorrhage, unspecified: Secondary | ICD-10-CM

## 2013-10-03 DIAGNOSIS — I10 Essential (primary) hypertension: Secondary | ICD-10-CM

## 2013-10-03 DIAGNOSIS — F17200 Nicotine dependence, unspecified, uncomplicated: Secondary | ICD-10-CM

## 2013-10-03 NOTE — Progress Notes (Signed)
Occupational Therapy Session Note  Patient Details  Name: Jay Wise MRN: [id number] Date of Birth: Dec 29, 1960  Today's Date: 10/03/2013 OT Individual Time: 9244-6286 OT Individual Time Calculation (min): 60 min    Short Term Goals: Week 1:  OT Short Term Goal 1 (Week 1): Pt will attend to R side visual field to prevent injury during functional mobility and to locate ADL items with Min verbal cues. OT Short Term Goal 1 - Progress (Week 1): Met OT Short Term Goal 2 (Week 1): Pt will perform shower transfer with Min A in order to increase I in functional transfers. OT Short Term Goal 2 - Progress (Week 1): Met OT Short Term Goal 3 (Week 1): Pt will perform toileting with Min A in order to increase I in self care. OT Short Term Goal 3 - Progress (Week 1): Met OT Short Term Goal 4 (Week 1): Pt will perform toilet transfer with Min A in order to increase I in functional mobility. OT Short Term Goal 4 - Progress (Week 1): Met OT Short Term Goal 5 (Week 1): Pt will perform 10 minutes of dynamic standing balance activity with min A for balance in order to increase balance strategies for occupational performance.  OT Short Term Goal 5 - Progress (Week 1): Progressing toward goal  Skilled Therapeutic Interventions/Progress Updates: Patient and wife requested to be checked off for wife assisting patient in room and toileting and to ambulate to/fr nurses station as wife stated, "He is sitting in here doing nothing as he is a naturally antsy person who needs to move around."   Wife able to demonstrate safety awareness, especially to cue husband on occasion right drag during the step through.  He and she were able to return demosntration for safe car transfer.   He completed shower stepover (via the blue shower stall square) with CGA and his right foot was able to clear the threshold.   AS well, he completed about 25 minutes on the Nu Step level 4 with occasional 30 second rest breaks.  He stated that  beginning yesterday, his vision is blurry at times.     Therapy Documentation Precautions:  Precautions Precautions: Fall Precaution Comments: reduced sensation RLE Restrictions Weight Bearing Restrictions: No Pain: denied   See FIM for current functional status  Therapy/Group: Individual Therapy  Alfredia Ferguson [facility] 10/03/2013, 3:04 PM

## 2013-10-03 NOTE — Progress Notes (Addendum)
Jay Wise is a 53 y.o. male 1960/12/13 [id number]  Subjective: No new complaints. No new problems. Slept well. Feeling OK.  Objective: Vital signs in last 24 hours: Temp:  [97.3 F (36.3 C)-97.9 F (36.6 C)] 97.6 F (36.4 C) (09/12 0600) Pulse Rate:  [55-59] 55 (09/12 0600) Resp:  [18-20] 18 (09/12 0600) BP: (162-178)/(87-93) 178/87 mmHg (09/12 0600) SpO2:  [97 %-100 %] 97 % (09/12 0600) Weight change:  Last BM Date: 10/01/13  Intake/Output from previous day: 09/11 0701 - 09/12 0700 In: 480 [P.O.:480] Out: -  Last cbgs: CBG (last 3)  No results found for this basename: GLUCAP,  in the last 72 hours   Physical Exam General: No apparent distress   HEENT: not dry Lungs: Normal effort. Lungs clear to auscultation, no crackles or wheezes. Cardiovascular: Regular rate and rhythm, no edema Abdomen: S/NT/ND; BS(+) Musculoskeletal:  unchanged Neurological: No new neurological deficits Wounds: N/A    Skin: clear   Mental state: Alert, oriented, cooperative    Lab Results: BMET    Component Value Date/Time   NA 138 10/01/2013 0717   K 4.1 10/01/2013 0717   CL 99 10/01/2013 0717   CO2 26 10/01/2013 0717   GLUCOSE 97 10/01/2013 0717   BUN 27* 10/01/2013 0717   CREATININE 1.32 10/01/2013 0717   CALCIUM 9.8 10/01/2013 0717   GFRNONAA 59* 10/01/2013 0717   GFRAA 69* 10/01/2013 0717   CBC    Component Value Date/Time   WBC 5.5 09/30/2013 0647   RBC 4.89 09/30/2013 0647   HGB 15.5 09/30/2013 0647   HCT 45.8 09/30/2013 0647   PLT 127* 09/30/2013 0647   MCV 93.7 09/30/2013 0647   MCH 31.7 09/30/2013 0647   MCHC 33.8 09/30/2013 0647   RDW 13.7 09/30/2013 0647   LYMPHSABS 1.0 09/30/2013 0647   MONOABS 0.5 09/30/2013 0647   EOSABS 0.1 09/30/2013 0647   BASOSABS 0.0 09/30/2013 0647    Studies/Results: No results found.  Medications: I have reviewed the patient's current medications.  Assessment/Plan:  1. 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 DVT  Prophylaxis/Anticoagulation: Pharmaceutical: Lovenox, plt a little low, monitor  3. Pain Management: Tylenol prn  4. Mood: Team to provide ego support. LCSW to follow for evaluation and support.  5. Neuropsych: This patient is capable of making decisions on his own behalf.  6. Skin/Wound Care: Routine pressure relief measures. Patient able to reposition himself without difficulty.  7. HTN: Monitor every 8 hours. Continue Norvasc, lisinopril , apresoline and labetalol.difficult to manage add low dose HCTZ  -bp's 150-160, adjust meds, monitor  - HR in 50-60s reduce normodyne  8. Tobacco abuse: 2 PPD smoker. nicotine patch  . Albuterol inhaler prn. CXR findings of bronchitis +/- PNA, no fever so favor the latter, will add levaquin, Likely has COPD. May need longer acting inhaler        Length of stay, days: 8  Sonda Primes , MD 10/03/2013, 12:57 PM

## 2013-10-04 ENCOUNTER — Inpatient Hospital Stay (HOSPITAL_COMMUNITY): Payer: PRIVATE HEALTH INSURANCE

## 2013-10-04 DIAGNOSIS — M25569 Pain in unspecified knee: Secondary | ICD-10-CM

## 2013-10-04 MED ORDER — TRAMADOL HCL 50 MG PO TABS
50.0000 mg | ORAL_TABLET | Freq: Two times a day (BID) | ORAL | Status: DC | PRN
  Administered 2013-10-04 (×2): 50 mg via ORAL
  Administered 2013-10-04 – 2013-10-07 (×5): 100 mg via ORAL
  Filled 2013-10-04 (×3): qty 2
  Filled 2013-10-04 (×2): qty 1
  Filled 2013-10-04 (×2): qty 2

## 2013-10-04 NOTE — Plan of Care (Signed)
Problem: RH SAFETY Goal: RH STG ADHERE TO SAFETY PRECAUTIONS W/ASSISTANCE/DEVICE STG Adhere to Safety Precautions With Assistance/Device. Supervision  Outcome: Not Progressing Pt getting up without assist, and able to remove quick release belt, states he does not like to sit around a lot. Does not call for assist. RN got therapy to check wife off for transfers in the room and ambulation short distances in the hall.

## 2013-10-04 NOTE — Progress Notes (Signed)
Physical Therapy Session Note  Patient Details  Name: Jay Wise MRN: [id number] Date of Birth: [birth year]  Today's Date: 10/04/2013 PT Individual Time: 1300-1330 PT Individual Time Calculation (min): 30 min   Short Term Goals: Week 2:  PT Short Term Goal 1 (Week 2): STG=LTG due to LOS, S overall PT Short Term Goal 4 (Week 2): Pt will   Skilled Therapeutic Interventions/Progress Updates:    Pt received standing at EOB by himself, agreeable to participate in therapy. Pt ambulated 100' to rehab gym w/ close S w/ RW. Pt complaining of high pain in posterior R knee over hamstring tendons. In rehab gym therapist provided gentle stretching and grade 1 A-P mobs for pain control, pt reported some relief. Pt completed x10 bridging with cues to maintain hips in neutral rotation w/ 5 second holds. Pt rolled supine>L>prone to complete x10 HS curls w/ cues for slow eccentric movements. Pt rolled back to supine and moved to sit all w/ S. Pt reported knee pain had eased off w/ treatment. Pt ambulated 100' back to room w/ RW and close S, left seated in recliner w/ ice pack on posterior R knee w/ wife present and all needs within reach.  Therapy Documentation Precautions:  Precautions Precautions: Fall Precaution Comments: reduced sensation RLE Restrictions Weight Bearing Restrictions: No General:   Vital Signs:   Pain: Pain Assessment Pain Assessment: 0-10 Pain Score: 6  Pain Type: Acute pain Pain Location: Knee Pain Orientation: Right Pain Descriptors / Indicators: Aching;Sore Pain Frequency: Constant Pain Onset: On-going Patients Stated Pain Goal: 2 Pain Intervention(s): Massage, gentle Mobs, stretching Mobility:   Locomotion : Ambulation Ambulation/Gait Assistance: 4: Min guard;5: Supervision  Trunk/Postural Assessment :    Balance:   Exercises:   Other Treatments:    See FIM for current functional status  Therapy/Group: Individual Therapy  Hosie Spangle Hosie Spangle,  PT, DPT 10/04/2013, 1:55 PM

## 2013-10-04 NOTE — Plan of Care (Signed)
Problem: RH PAIN MANAGEMENT Goal: RH STG PAIN MANAGED AT OR BELOW PT'S PAIN GOAL <3 on a 0-10 scale  Outcome: Not Progressing Began having pain in R knee after doing extra therapy yesterday. Received order for ultram prn and ace wrapped knee, applying ice prn.

## 2013-10-04 NOTE — Progress Notes (Addendum)
Jay Wise is a 53 y.o. male December 05, 1960 [id number]  Subjective: C/o R knee pain after therapy. No other new problems. Slept well. Feeling OK.  Objective: Vital signs in last 24 hours: Temp:  [97.6 F (36.4 C)-97.7 F (36.5 C)] 97.7 F (36.5 C) (09/13 0543) Pulse Rate:  [58-63] 62 (09/13 0543) Resp:  [18-20] 18 (09/13 0543) BP: (134-167)/(74-92) 167/74 mmHg (09/13 0543) SpO2:  [98 %-99 %] 99 % (09/13 0543) Weight change:  Last BM Date: 10/01/13  Intake/Output from previous day: 09/12 0701 - 09/13 0700 In: 720 [P.O.:720] Out: -  Last cbgs: CBG (last 3)  No results found for this basename: GLUCAP,  in the last 72 hours   Physical Exam General: No apparent distress   HEENT: not dry Lungs: Normal effort. Lungs clear to auscultation, no crackles or wheezes. Cardiovascular: Regular rate and rhythm, no edema Abdomen: S/NT/ND; BS(+) Musculoskeletal:  No knee swelling B Neurological: No new neurological deficits Wounds: N/A    Skin: clear   Mental state: Alert, oriented, cooperative    Lab Results: BMET    Component Value Date/Time   NA 138 10/01/2013 0717   K 4.1 10/01/2013 0717   CL 99 10/01/2013 0717   CO2 26 10/01/2013 0717   GLUCOSE 97 10/01/2013 0717   BUN 27* 10/01/2013 0717   CREATININE 1.32 10/01/2013 0717   CALCIUM 9.8 10/01/2013 0717   GFRNONAA 59* 10/01/2013 0717   GFRAA 69* 10/01/2013 0717   CBC    Component Value Date/Time   WBC 5.5 09/30/2013 0647   RBC 4.89 09/30/2013 0647   HGB 15.5 09/30/2013 0647   HCT 45.8 09/30/2013 0647   PLT 127* 09/30/2013 0647   MCV 93.7 09/30/2013 0647   MCH 31.7 09/30/2013 0647   MCHC 33.8 09/30/2013 0647   RDW 13.7 09/30/2013 0647   LYMPHSABS 1.0 09/30/2013 0647   MONOABS 0.5 09/30/2013 0647   EOSABS 0.1 09/30/2013 0647   BASOSABS 0.0 09/30/2013 0647    Studies/Results: No results found.  Medications: I have reviewed the patient's current medications.  Assessment/Plan:  1. Functional deficits secondary to Left thalamic IPH , R HP,  Aphasia  2. DVT Prophylaxis/Anticoagulation: Pharmaceutical: Lovenox, plt a little low, monitor  3. Pain Management: Tylenol prn  4. Mood: Team to provide ego support. LCSW to follow for evaluation and support.  5. Neuropsych: This patient is capable of making decisions on his own behalf.  6. Skin/Wound Care: Routine pressure relief measures. Patient able to reposition himself without difficulty.  7. HTN: Monitor every 8 hours. Continue Norvasc, lisinopril , apresoline and labetalol.difficult to manage add low dose HCTZ  -bp's 150-160, adjust meds, monitor  - HR in 50-60s reduce normodyne  8. Tobacco abuse: 2 PPD smoker. nicotine patch  . Albuterol inhaler prn. CXR findings of bronchitis +/- PNA, no fever so favor the latter, will add levaquin, Likely has COPD. May need longer acting inhaler  9. Knee pain R. Tramadol prn       Length of stay, days: 9  Sonda Primes , MD 10/04/2013, 11:35 AM

## 2013-10-04 NOTE — Progress Notes (Signed)
Occupational Therapy Session Note  Patient Details  Name: Jay Wise MRN: [id number] Date of Birth: [birth year]  Today's Date: 10/04/2013 OT Individual Time: 1140-1210 OT Individual Time Calculation (min): 30 min     Skilled Therapeutic Interventions/Progress Updates: Patient elected to use upper extremity armitron as he c/o of pain in his lateral knee since last night and did not want to complete any standing or ambulatory activities.   RN gave pain meds.     Therapy Documentation Precautions:  Precautions Precautions: Fall Precaution Comments: reduced sensation RLE Restrictions Weight Bearing Restrictions: No Pain: Pain Assessment Pain Assessment: 0-10 Pain Score: 4  Pain Type: Acute pain Pain Location: Knee Pain Orientation: Right Pain Descriptors / Indicators: Sore Pain Frequency: Intermittent Pain Onset: On-going Patients Stated Pain Goal: 2 Pain Intervention(s): Medication (See eMAR)  See FIM for current functional status  Therapy/Group: Individual Therapy  [name] [facility] 10/04/2013, 12:27 PM

## 2013-10-04 NOTE — Progress Notes (Signed)
Pt c/o R knee pain this morning, he feels it is from doing extra therapy on the exercise machines yesterday. Tylenol  given this AM with little relief, then tried ice pack but pain still 6/10. No swelling/redness noted. Received order from MD for ultram 50-100mg  BID PRN. Will administer to pt and monitor for improvement. Mick Sell RN

## 2013-10-05 ENCOUNTER — Inpatient Hospital Stay (HOSPITAL_COMMUNITY): Payer: PRIVATE HEALTH INSURANCE

## 2013-10-05 ENCOUNTER — Inpatient Hospital Stay (HOSPITAL_COMMUNITY): Payer: PRIVATE HEALTH INSURANCE | Admitting: Occupational Therapy

## 2013-10-05 ENCOUNTER — Encounter (HOSPITAL_COMMUNITY): Payer: Self-pay

## 2013-10-05 DIAGNOSIS — I739 Peripheral vascular disease, unspecified: Secondary | ICD-10-CM | POA: Diagnosis present

## 2013-10-05 DIAGNOSIS — I10 Essential (primary) hypertension: Secondary | ICD-10-CM

## 2013-10-05 DIAGNOSIS — I619 Nontraumatic intracerebral hemorrhage, unspecified: Secondary | ICD-10-CM

## 2013-10-05 MED ORDER — IOHEXOL 300 MG/ML  SOLN
100.0000 mL | Freq: Once | INTRAMUSCULAR | Status: AC | PRN
  Administered 2013-10-05: 100 mL via INTRAVENOUS

## 2013-10-05 NOTE — Progress Notes (Signed)
Social Work Patient ID: Jay Wise, male   DOB: Jan 12, 1961, 53 y.o.   MRN: [id number] Met with pt and spoke with wife via telephone to discuss discharge Wed and need for family education.  She can be here Wed at 9;00 for training. She plans to take Wed off, she reports different family will be staying with him at home.  She is concerned about his sore knee and reason for wrapping it.  Questions Readiness for discharge.  Has received call from [facility] regarding PCP appointment on Friday.  Agreeable to ordering rolling walker and tub seat.  OP referral made For Pt,Ot,Sp follow up.  Work on discharge on wed.

## 2013-10-05 NOTE — Progress Notes (Signed)
Speech Language Pathology Daily Session Note  Patient Details  Name: Jay Wise MRN: [id number] Date of Birth: 08-Jun-1960  Today's Date: 10/05/2013 SLP Individual Time: 7829-5621 SLP Individual Time Calculation (min): 45 min  Short Term Goals: Week 1: SLP Short Term Goal 1 (Week 1): Patient will utilize word finding strategies to express needs and wants with Mod clinician cues SLP Short Term Goal 2 (Week 1): Patient will follow multiple step commands during functional tasks with Min clinician cues SLP Short Term Goal 3 (Week 1): Patient will increase reading comprehension accuracy at the paragraph level with with Mod clinician cues SLP Short Term Goal 4 (Week 1): Patient will complete assessment of written expression  Skilled Therapeutic Interventions: Skilled treatment focused on language goals. SLP facilitated session with structured sequencing task, targeting both receptive and expressive language skills. Pt required Min cues for complex comprehension and problem solving throughout task for accuracy. Pt required Min cues for utilization of word-finding strategies throughout task, with patient able to ultimately generate words during episodes of anomia by allowing himself extra time. SLP provided education and review of word-finding strategies. Continue plan of care.   FIM:  Comprehension Comprehension Mode: Auditory Comprehension: 5-Follows basic conversation/direction: With extra time/assistive device Expression Expression Mode: Verbal Expression: 5-Expresses basic needs/ideas: With extra time/assistive device Social Interaction Social Interaction: 6-Interacts appropriately with others with medication or extra time (anti-anxiety, antidepressant). Problem Solving Problem Solving: 4-Solves basic 75 - 89% of the time/requires cueing 10 - 24% of the time Memory Memory: 3-Recognizes or recalls 50 - 74% of the time/requires cueing 25 - 49% of the time  Pain Pain Assessment Pain  Assessment: No/denies pain  Therapy/Group: Individual Therapy   Maxcine Ham, M.A. CCC-SLP [phone]  Maxcine Ham 10/05/2013, 2:44 PM

## 2013-10-05 NOTE — Progress Notes (Signed)
Physical Therapy Session Note  Patient Details  Name: Jay Wise MRN: [id number] Date of Birth: [birth year]  Today's Date: 10/05/2013 PT Individual Time: 6962-9528; 4132-4401 PT Individual Time Calculation (min): 60 min , 33 min  Short Term Goals: Week 2:  PT Short Term Goal 1 (Week 2): STG=LTG due to LOS, S overall  Skilled Therapeutic Interventions/Progress Updates:   tx 1:    Simulated car transfer to sedan height seat, min assist for tactile cues for L hand placement.  Neuro re-ed for RLE on Kinetron in standing without UE support, 20 cm/sec, x 20 cycles; gait without AD, gait stepping over obstacles, gait with RW x 150' r AFO, min guard assist for LOB due to R foot catching when fatigued  Pt encountered 3/6 obstacles on floor with R foot, ACE on for foot drop; using R hand to retrieve items , pt had 2 LOB as R foot caught floor -requiring max assist to prevent fall.  With divided attention of using R hand to retrieve items, pt's attention to RLe and balance is decreased.   Pt returned to room to recliner, and PT reclined it and elevated legs, removed RAFO.  Quick release belt applied and pt encouraged to nap.  Tx 2: Floor transfer with supervision after demo.  Pt c/o bil hamstring soreness during this.  PT recommended ice after tx.  Gait with RW as above, wearing RAFO x 150'.  Pt had one LOB forward as his R foot caught the floor, requiring mod assist to prevent fall. Pt tends to ambulate at too great a speed,  and has poor awareness of RLE.  RW has been marked "go slow! " per pt's request to remind him to slow down.  neuromuscular re-education as above for alternating coordinated R/L heel taps in sitting, kicking ball with RLE with LUE support, soccer drills with R foot on soccer ball with LUE support.  Pt's mother in law will be here on day of d/c for family ed.  She will be with pt to supervise him at home, while wife works.    Therapy Documentation Precautions:   Precautions Precautions: Fall Precaution Comments: reduced sensation RLE Restrictions Weight Bearing Restrictions: No   Pain: Pain Assessment Pain Score: 2   R hamstrings; PT recommended pt request ice. Locomotion : Ambulation Ambulation: Yes Ambulation/Gait Assistance: 4: Min guard Assistive device: Rolling walker Ambulation/Gait Assistance Details: Manual facilitation for weight bearing Ambulation/Gait Assistance Details: for R foot catching floor; wearing R ToeOff AFO    Other Treatments: Treatments Therapeutic Activity: stepping over obstacles, Kinetron in standing, retrieving items floor and transporting in R hand, tall kneeling  Neuromuscular Facilitation: Right;Activity to increase grading;Activity to increase sustained activation;Activity to increase lateral weight shifting;Activity to increase timing and sequencing;Activity to increase motor control Weight Bearing Technique Weight Bearing Technique: Yes  See FIM for current functional status  Therapy/Group: Individual Therapy  Jay Wise 10/05/2013, 10:31 AM

## 2013-10-05 NOTE — Progress Notes (Signed)
Jay Wise PHYSICAL MEDICINE & REHABILITATION     PROGRESS NOTE    Subjective/Complaints: No new issues overnite. Breathing OK Discussed D/C date and no driving A  review of systems has been performed and if not noted above is otherwise negative.   Objective: Vital Signs: Blood pressure 162/75, pulse 50, temperature 98.4 F (36.9 C), temperature source Oral, resp. rate 17, height  (1.854 m), weight 83.1 kg (183 lb 3.2 oz), SpO2 97.00%. No results found. No results found for this basename: WBC, HGB, HCT, PLT,  in the last 72 hours No results found for this basename: NA, K, CL, CO, GLUCOSE, BUN, CREATININE, CALCIUM,  in the last 72 hours CBG (last 3)  No results found for this basename: GLUCAP,  in the last 72 hours  Wt Readings from Last 3 Encounters:  10/01/13 83.1 kg (183 lb 3.2 oz)  09/23/13 89.2 kg (196 lb 10.4 oz)    Physical Exam:  Constitutional: He appears well-developed.  Mouth: dentition fair, oral mucosa pink/moist  Eyes: EOM are normal.  Neck: Normal range of motion. Neck supple. No thyromegaly present.  Cardiovascular: Normal rate and regular rhythm. No murmur or rubs  Respiratory: Diffuse wheezing noted and generally diminished air movement. No respiratory distress.  GI: Soft. Bowel sounds are normal. He exhibits no distension. Non-tender Neurological: He is alert.  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   RUE: 4+/5 shoulder, deltoid, bicep, tricep, grip, . RLE: 4/5 HF, KE, ADF/APF---some apraxia with motor efforts. Sensation intact to LT . DTR's 1+.  Psych: pleasant and cooperative.  Skin: Skin is warm and dry. A few scattered abrasions   Assessment/Plan: 1. 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 Physiatrist is providing close team supervision and 24 hour management of active medical problems listed  below. Physiatrist and rehab team continue to assess barriers to discharge/monitor patient progress toward functional and medical goals.  FIM: FIM - Bathing Bathing Steps Patient Completed: Chest;Right Arm;Left Arm;Abdomen;Front perineal area;Buttocks;Right upper leg;Left upper leg;Right lower leg (including foot);Left lower leg (including foot) Bathing: 5: Supervision: Safety issues/verbal cues  FIM - Upper Body Dressing/Undressing Upper body dressing/undressing steps patient completed: Thread/unthread right sleeve of pullover shirt/dresss;Thread/unthread left sleeve of pullover shirt/dress;Put head through opening of pull over shirt/dress;Pull shirt over trunk Upper body dressing/undressing: 7: Complete Independence: No helper FIM - Lower Body Dressing/Undressing Lower body dressing/undressing steps patient completed: Thread/unthread right pants leg;Thread/unthread left pants leg;Pull pants up/down;Don/Doff right sock;Don/Doff left sock;Don/Doff left shoe;Fasten/unfasten right shoe;Fasten/unfasten left shoe;Thread/unthread right underwear leg;Thread/unthread left underwear leg;Pull underwear up/down;Don/Doff right shoe Lower body dressing/undressing: 5: Supervision: Safety issues/verbal cues  FIM - Toileting Toileting steps completed by patient: Adjust clothing prior to toileting;Performs perineal hygiene;Adjust clothing after toileting Toileting Assistive Devices: Grab bar or rail for support Toileting: 5: Supervision: Safety issues/verbal cues  FIM - Diplomatic Services operational officer Devices: Grab bars Toilet Transfers: 3-To toilet/BSC: Mod A (lift or lower assist);3-From toilet/BSC: Mod A (lift or lower assist)  FIM - Bed/Chair Transfer Bed/Chair Transfer Assistive Devices: Arm rests;Walker Bed/Chair Transfer: 5: Chair or W/C > Bed: Supervision (verbal cues/safety issues);5: Supine > Sit: Supervision (verbal cues/safety issues);5: Sit > Supine: Supervision (verbal cues/safety  issues)  FIM - Locomotion: Wheelchair Distance: 150 Locomotion: Wheelchair: 0: Activity did not occur FIM - Locomotion: Ambulation Locomotion: Ambulation Assistive Devices: Designer, industrial/product Ambulation/Gait Assistance: 4: Min guard;5: Supervision Locomotion: Ambulation: 2: Travels 50 - 149 ft  with minimal assistance (Pt.>75%)  Comprehension Comprehension Mode: Auditory Comprehension: 5-Follows basic conversation/direction: With no assist  Expression Expression Mode: Verbal Expression: 5-Expresses basic needs/ideas: With no assist  Social Interaction Social Interaction: 6-Interacts appropriately with others with medication or extra time (anti-anxiety, antidepressant).  Problem Solving Problem Solving Mode: Asleep Problem Solving: 4-Solves basic 75 - 89% of the time/requires cueing 10 - 24% of the time  Memory Memory Mode: Asleep Memory: 3-Recognizes or recalls 50 - 74% of the time/requires cueing 25 - 49% of the time  Medical Problem List and Plan:  1. 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 DVT Prophylaxis/Anticoagulation: Pharmaceutical: Lovenox, plt a little low, monitor  3. Pain Management: Tylenol prn  4. Mood: Team to provide ego support. LCSW to follow for evaluation and support.  5. Neuropsych: This patient is capable of making decisions on his own behalf.  6. Skin/Wound Care: Routine pressure relief measures. Patient able to reposition himself without difficulty.  7. HTN: Monitor every 8 hours. Continue Norvasc, lisinopril , apresoline and labetalol.difficult to manage adjust HCTZ  -bp's 150-160, adjust meds, monitor  - HR in 50-60s reduce normodyne 8. Tobacco abuse: 2 PPD smoker.  nicotine patch  . Albuterol inhaler prn.  CXR findings of bronchitis +/- PNA, no fever so favor the latter, will add levaquin,  Likely has COPD. May need longer acting inhaler    LOS (Days) 10 A FACE TO FACE EVALUATION WAS PERFORMED  Erick Colace 10/05/2013 6:57 AM

## 2013-10-05 NOTE — Progress Notes (Signed)
Occupational Therapy Session Note  Patient Details  Name: Jay Wise MRN: [id number] Date of Birth: [birth year]  Today's Date: 10/05/2013 OT Individual Time: 2902-1115 OT Individual Time Calculation (min): 45 min    Short Term Goals: Week 1:  OT Short Term Goal 1 (Week 1): Pt will attend to R side visual field to prevent injury during functional mobility and to locate ADL items with Min verbal cues. OT Short Term Goal 1 - Progress (Week 1): Met OT Short Term Goal 2 (Week 1): Pt will perform shower transfer with Min A in order to increase I in functional transfers. OT Short Term Goal 2 - Progress (Week 1): Met OT Short Term Goal 3 (Week 1): Pt will perform toileting with Min A in order to increase I in self care. OT Short Term Goal 3 - Progress (Week 1): Met OT Short Term Goal 4 (Week 1): Pt will perform toilet transfer with Min A in order to increase I in functional mobility. OT Short Term Goal 4 - Progress (Week 1): Met OT Short Term Goal 5 (Week 1): Pt will perform 10 minutes of dynamic standing balance activity with min A for balance in order to increase balance strategies for occupational performance.  OT Short Term Goal 5 - Progress (Week 1): Progressing toward goal Week 2:  OT Short Term Goal 1 (Week 2): STGs = LTGs due to discharge in less than 1 week (LTGs upgraded to mod I for bathing, dressing, toileting, toilet transfers)  Skilled Therapeutic Interventions/Progress Updates:    Pt seen for BADL retraining of shower and dressing with a focus on functional mobility and balance.  Pt was able to ambulate around his room with RW with no LOB and improve R leg swing with supervision and transfer in and out of shower with supervision). He was able to complete self care with mod I. Pt ambulated to gym with RW with no LOB or cues to clear R foot. He requested to work on [employer], [employer]. A HEP developed for pt and he worked on each exercise with a 3# weight (the 5# was too difficult at this  time).  Pt provided with 5 exercises to focus on written on a handout. Pt is developing strength well, but continues to have difficulty coordinating wrist stabilization with proximal shoulder movement. Pt ambulated back to his room.    Therapy Documentation Precautions:  Precautions Precautions: Fall Precaution Comments: reduced sensation RLE Restrictions Weight Bearing Restrictions: No    Vital Signs: Therapy Vitals Pulse Rate: 60 BP: 118/66 mmHg Pain: Pain Assessment Pain Assessment: No/denies pain  ADL:  See FIM for current functional status  Therapy/Group: Individual Therapy  Hooks 10/05/2013, 11:14 AM

## 2013-10-05 NOTE — Plan of Care (Signed)
Problem: RH BOWEL ELIMINATION Goal: RH STG MANAGE BOWEL WITH ASSISTANCE STG Manage Bowel with Mod I  Outcome: Progressing No incontinent episode reported

## 2013-10-06 ENCOUNTER — Inpatient Hospital Stay (HOSPITAL_COMMUNITY): Payer: PRIVATE HEALTH INSURANCE | Admitting: *Deleted

## 2013-10-06 ENCOUNTER — Inpatient Hospital Stay (HOSPITAL_COMMUNITY): Payer: PRIVATE HEALTH INSURANCE | Admitting: Speech Pathology

## 2013-10-06 ENCOUNTER — Encounter (HOSPITAL_COMMUNITY): Payer: PRIVATE HEALTH INSURANCE | Admitting: Occupational Therapy

## 2013-10-06 DIAGNOSIS — I10 Essential (primary) hypertension: Secondary | ICD-10-CM

## 2013-10-06 DIAGNOSIS — I619 Nontraumatic intracerebral hemorrhage, unspecified: Secondary | ICD-10-CM

## 2013-10-06 MED ORDER — HYDROCHLOROTHIAZIDE 12.5 MG PO CAPS
12.5000 mg | ORAL_CAPSULE | Freq: Every day | ORAL | Status: DC
Start: 2013-10-06 — End: 2013-10-07
  Administered 2013-10-06 – 2013-10-07 (×2): 12.5 mg via ORAL
  Filled 2013-10-06 (×3): qty 1

## 2013-10-06 MED ORDER — LABETALOL HCL 200 MG PO TABS
200.0000 mg | ORAL_TABLET | Freq: Two times a day (BID) | ORAL | Status: DC
  Administered 2013-10-06 – 2013-10-07 (×3): 200 mg via ORAL
  Filled 2013-10-06 (×5): qty 1

## 2013-10-06 NOTE — Progress Notes (Signed)
Physical Therapy Discharge Summary  Patient Details  Name: Jay Wise MRN: [id number] Date of Birth: July 24, 1960  Today's Date: 10/06/2013 PT Individual Time: 1000-1100 PT Individual Time Calculation (min): 60 min    Patient has met 13 out of 13 long term goals due to improved activity tolerance, improved balance, improved postural control, increased strength, ability to compensate for deficits, functional use of  right upper extremity and right lower extremity and improved coordination.  Patient to discharge at an ambulatory level Supervision.   Patient's care partner is independent to provide the necessary cognitive assistance at discharge.  Recommendation:  Patient will benefit from ongoing skilled PT services in outpatient setting to continue to advance safe functional mobility, address ongoing impairments in motor control, activity tolerance, awareness, coordination, pain, strength/ROM, and minimize fall risk.  Equipment: RW  Reasons for discharge: treatment goals met and discharge from hospital  Patient/family agrees with progress made and goals achieved: Yes  PT Discharge Precautions/Restrictions Precautions Precautions: Fall Precaution Comments: reduced sensation RLE Restrictions Weight Bearing Restrictions: No   Pain Pain Assessment Pain Score: 6  - Modified tx and provided ice for calf after tx.  Vision/Perception  Vision - History Baseline Vision: No visual deficits Patient Visual Report: No change from baseline Vision - Assessment Eye Alignment: Within Functional Limits Perception Perception: Within Functional Limits Praxis Praxis: Intact  Cognition Overall Cognitive Status: Impaired/Different from baseline Arousal/Alertness: Awake/alert Orientation Level: Oriented X4 Memory: Impaired Memory Impairment: Storage deficit;Retrieval deficit Awareness: Impaired Awareness Impairment: Emergent impairment Problem Solving: Impaired Problem Solving Impairment:  Functional basic Safety/Judgment: Impaired Comments: Pt needs S for safety wtih memory, problem solving, and emergent awareness to reduce risk of falls.  Sensation Sensation Light Touch: Impaired by gross assessment Light Touch Impaired Details: Impaired RLE;Impaired RUE Stereognosis: Appears Intact Proprioception: Appears Intact Additional Comments: Intact, but decreased on R.  Coordination Gross Motor Movements are Fluid and Coordinated: No Fine Motor Movements are Fluid and Coordinated: No Coordination and Movement Description: Pt with decreased accuracy and timing with RLE/UE Heel Shin Test: Pt able to perform wtih minor diffiuclty with accuracy Motor  Motor Motor: Hemiplegia;Motor impersistence Motor - Discharge Observations: Pt wtih good RLE strength, bu difficulty incorporating coordination an daccuracy during functional tasks (toe catches often with gait on stairs and when fatigued)   Mobility Bed Mobility Bed Mobility: Supine to Sit;Sit to Supine Supine to Sit: 6: Modified independent (Device/Increase time) Sit to Supine: 6: Modified independent (Device/Increase time) Transfers Transfers: Yes Stand Pivot Transfers: 5: Supervision Locomotion  Ambulation Ambulation: Yes Ambulation/Gait Assistance: 5: Supervision Ambulation Distance (Feet): 150 Feet Assistive device: Rolling walker Ambulation/Gait Assistance Details: Pt continues to demonstrate periodic R toe catching, worse on carpet, but better with Toe Off AFO. Does well with cues for increasing R foot clearance, but does not maintain without cues. Orthotist to add toe cap on shoe to reduce friction.  Gait Gait: Yes Gait Pattern: Impaired Gait Pattern: Decreased hip/knee flexion - right;Decreased dorsiflexion - right Stairs / Additional Locomotion Stairs: Yes Stairs Assistance: 5: Supervision Stairs Assistance Details (indicate cue type and reason): Pt needs cues for  sequence as well as R foot clearance and full  placement on step.  Stair Management Technique: One rail Right;Step to pattern Number of Stairs: 12 Height of Stairs: 6 Wheelchair Mobility Wheelchair Mobility: No (Pt ambulatory)  Trunk/Postural Assessment  Cervical Assessment Cervical Assessment: Within Functional Limits Thoracic Assessment Thoracic Assessment: Within Functional Limits Lumbar Assessment Lumbar Assessment: Within Functional Limits Postural Control Postural Control: Within Functional  Limits Protective Responses: delayed and inadequate when LOB to R, decreased stepping strategy  Balance Balance Balance Assessed: Yes Standardized Balance Assessment Standardized Balance Assessment: Berg Balance Test Berg Balance Test Sit to Stand: Able to stand without using hands and stabilize independently Standing Unsupported: Able to stand safely 2 minutes Sitting with Back Unsupported but Feet Supported on Floor or Stool: Able to sit safely and securely 2 minutes Stand to Sit: Sits safely with minimal use of hands Transfers: Able to transfer safely, minor use of hands Standing Unsupported with Eyes Closed: Able to stand 10 seconds safely Standing Ubsupported with Feet Together: Able to place feet together independently and stand 1 minute safely From Standing, Reach Forward with Outstretched Arm: Can reach confidently >25 cm (10") From Standing Position, Pick up Object from Floor: Able to pick up shoe, needs supervision From Standing Position, Turn to Look Behind Over each Shoulder: Looks behind one side only/other side shows less weight shift Turn 360 Degrees: Able to turn 360 degrees safely one side only in 4 seconds or less Standing Unsupported, Alternately Place Feet on Step/Stool: Needs assistance to keep from falling or unable to try Standing Unsupported, One Foot in Front: Able to plae foot ahead of the other independently and hold 30 seconds Standing on One Leg: Able to lift leg independently and hold equal to or more than  3 seconds Total Score: 46 Dynamic Sitting Balance Dynamic Sitting - Level of Assistance: 6: Modified independent (Device/Increase time) Static Standing Balance Static Standing - Level of Assistance: 5: Stand by assistance Dynamic Standing Balance Dynamic Standing - Level of Assistance: 5: Stand by assistance Extremity Assessment  RUE Assessment RUE Assessment: Exceptions to [facility] (Decreased strength and functional corodination) LUE Assessment LUE Assessment: Within Functional Limits RLE Assessment RLE Assessment: Exceptions to [facility] RLE PROM (degrees) RLE Overall PROM Comments: tight hamstrinngs and heel cord; very painful; DF limited to 5 degrees) RLE Strength RLE Overall Strength Comments: 4+/5 strength throughout LE in MMT, but during gait and functional tasks, but demonstrated impersistence with DF, knee and hip flexion during swing, esp when fatigued.  LLE Assessment LLE Assessment: Within Functional Limits  See FIM for current functional status  Kennieth Rad, PT, DPT  10/06/2013, 12:24 PM

## 2013-10-06 NOTE — Progress Notes (Signed)
Physical Therapy Session Note  Patient Details  Name: Jay Wise MRN: [id number] Date of Birth: Jun 18, 1960  Today's Date: 10/06/2013 PT Individual Time: 1000-1100 PT Individual Time Calculation (min): 60 min   Short Term Goals: Week 2:  PT Short Term Goal 1 (Week 2): STG=LTG due to LOS, S overall  Skilled Therapeutic Interventions/Progress Updates:  Tx focused on orthotic fit/training, gait and stairs, community and home setting gait, Berg balance test, and pt education on CVA risk reduction. Pt was up ambulating in room to relief R calf pain; reminded of needing staff for safety. Pt needed several rest breaks throughout tx; educated on stroke risk reduction measures and fall risk reduction.   Gerald Stabs present from [employer], in agreement that Toe Off AFO best option for pt's impairments and lifestyle. A toe cap will be added to shoe due to repeated toe catch on tile and carpeted surfaces.   Pt c/o 7/10 R posterior calf pain, not new, not warm or tender to touch. Instructed pt in seated HS stretch, handout provided and advised to perform x3/day. Also performed standing runners stretch x30 sec.   Performed gait training in controlled, busy/communtiy simulated, and home settings x150'x150' and x50' respectively. Pt needed very close S due to decreased safety and speed, especially during turns and transitions. Toe caught with increasing frequency with fatigue. Pt able to adjust gait technique and increase clearance with cues, but not in absence of cues.   Performed flight of stairs with very close S and R rail only with AFO. Pt instructed in safe approach to stairs to always have safe RW distance. Pt needed sequence cues for stairs as well as safety cues for clearing R foot when ascending as well as fully placing R foot on step.   Administered Berg Balance test, see results below. Pt's score improved from 30 to 46/56, which is significant, but still indicating significant risk of falls.  Pt educated on fucntional implications of these results, as well as need to use RW at all times. Pt left up in recliner, LEs elevated, and donned his own lap belt. Pt has met all goals, family training to take place  Tomorrow for final education.       Therapy Documentation Precautions:  Precautions Precautions: Fall Precaution Comments: reduced sensation RLE Restrictions Weight Bearing Restrictions: No   Pain: Pain Assessment Pain Score: 6  - modified tx and provided ice after.   Balance: Balance Balance Assessed: Yes Standardized Balance Assessment Standardized Balance Assessment: Berg Balance Test Berg Balance Test Sit to Stand: Able to stand without using hands and stabilize independently Standing Unsupported: Able to stand safely 2 minutes Sitting with Back Unsupported but Feet Supported on Floor or Stool: Able to sit safely and securely 2 minutes Stand to Sit: Sits safely with minimal use of hands Transfers: Able to transfer safely, minor use of hands Standing Unsupported with Eyes Closed: Able to stand 10 seconds safely Standing Ubsupported with Feet Together: Able to place feet together independently and stand 1 minute safely From Standing, Reach Forward with Outstretched Arm: Can reach confidently >25 cm (10") From Standing Position, Pick up Object from Floor: Able to pick up shoe, needs supervision From Standing Position, Turn to Look Behind Over each Shoulder: Looks behind one side only/other side shows less weight shift Turn 360 Degrees: Able to turn 360 degrees safely one side only in 4 seconds or less Standing Unsupported, Alternately Place Feet on Step/Stool: Needs assistance to keep from falling or unable to  try Standing Unsupported, One Foot in Front: Able to plae foot ahead of the other independently and hold 30 seconds Standing on One Leg: Able to lift leg independently and hold equal to or more than 3 seconds Total Score: 46   See FIM for current functional  status  Therapy/Group: Individual Therapy Kennieth Rad, PT, DPT  10/06/2013, 12:27 PM

## 2013-10-06 NOTE — Progress Notes (Signed)
Speech Language Pathology Daily Session Note  Patient Details  Name: Jay Wise MRN: [id number] Date of Birth: [birth year]  Today's Date: 10/06/2013 SLP Individual Time: 1420-1520 SLP Individual Time Calculation (min): 60 min  Short Term Goals: Week 1: SLP Short Term Goal 1 (Week 1): Patient will utilize word finding strategies to express needs and wants with Mod clinician cues SLP Short Term Goal 2 (Week 1): Patient will follow multiple step commands during functional tasks with Min clinician cues SLP Short Term Goal 3 (Week 1): Patient will increase reading comprehension accuracy at the paragraph level with with Mod clinician cues SLP Short Term Goal 4 (Week 1): Patient will complete assessment of written expression  Skilled Therapeutic Interventions:  Pt was seen for skilled speech therapy targeting cognitive-linguistic goals.  Upon arrival, pt was seated upright in recliner and had removed quick release belt.  Pt required min assist instructional cues to lock brakes on wheelchair before transferring.  Pt also recalled route to ST treatment room from his room with intermittent supervision cues for awareness of obstacles in the environment.  SLP facilitated the session with a semi-complex new learning task targeting working memory and functional word finding, with pt requiring overall min assist faded to supervision cues to complete task for ~80% accuracy.  During task, pt was noted to initiate use of compensatory word finding strategies with supervision question cues.  SLP initiated skilled education related to discharge recommendations including follow up speech therapy, supervision for safety, and assistance for medication and financial management.  Pt in agreement.  Will plan to see pt at next available appointment to complete family training with pt's wife prior to discharge.  Continue per current plan of care.    FIM:  Comprehension Comprehension Mode: Auditory Comprehension: 5-Follows  basic conversation/direction: With extra time/assistive device Expression Expression Mode: Verbal Expression: 5-Expresses basic needs/ideas: With extra time/assistive device Social Interaction Social Interaction: 5-Interacts appropriately 90% of the time - Needs monitoring or encouragement for participation or interaction. Problem Solving Problem Solving: 4-Solves basic 75 - 89% of the time/requires cueing 10 - 24% of the time Memory Memory: 5-Recognizes or recalls 90% of the time/requires cueing < 10% of the time   Pain Pain Assessment Pain Assessment: No/denies pain  Therapy/Group: Individual Therapy  [name], M.A. CCC-SLP  Clerance Umland, [name] 10/06/2013, 4:31 PM

## 2013-10-06 NOTE — Progress Notes (Signed)
[place] PHYSICAL MEDICINE & REHABILITATION     PROGRESS NOTE    Subjective/Complaints: No new issues overnite. Breathing OK Discussed D/C date and no driving A  review of systems has been performed and if not noted above is otherwise negative.   Objective: Vital Signs: Blood pressure 149/83, pulse 57, temperature 98.2 F (36.8 C), temperature source Oral, resp. rate 18, height  (1.854 m), weight 83.1 kg (183 lb 3.2 oz), SpO2 99.00%. Dg Chest 2 View  10/05/2013   CLINICAL DATA:  Followup pneumonia  EXAM: CHEST  2 VIEW  COMPARISON:  09/25/2013  FINDINGS: Cardiac shadow is within normal limits. The lungs are well aerated bilaterally. There is persistent density in the right peritracheal region when compare with the prior exam. Given this stability over 10 days further evaluation by means of CT of the chest is recommended. No other focal parenchymal abnormality is noted. The bony structures are within normal limits.  IMPRESSION: Persistent increased density in the right peritracheal region as described. CT of the chest with contrast is recommended for further evaluation.  These results will be called to the ordering clinician or representative by the Radiologist Assistant, and communication documented in the PACS or zVision Dashboard.   Electronically Signed   By: Alcide Clever M.D.   On: 10/05/2013 16:57   Ct Chest W Contrast  10/05/2013   CLINICAL DATA:  Abnormal chest x-ray  EXAM: CT CHEST WITH CONTRAST  TECHNIQUE: Multidetector CT imaging of the chest was performed during intravenous contrast administration.  CONTRAST:  OMNIPAQUE IOHEXOL 300 MG/ML  SOLN  COMPARISON:  Prior chest x-rays 10/05/2013 and 09/25/2013  FINDINGS: Mediastinum: Unremarkable appearance of the thyroid gland. The mediastinal lymph nodes are not enlarged by CT criteria but slightly conspicuous in number and likely reactive. Unremarkable thoracic esophagus. No mediastinal mass.  Heart/Vascular: Cardiomegaly with  left ventricular dilatation. Small pericardial effusion. Atherosclerotic calcifications present throughout the coronary arteries. Atherosclerotic plaque results in a mild narrowing of the origin of the left subclavian artery. Conventional 3 vessel arch anatomy. No aneurysmal dilatation or evidence of dissection.  Lungs/Pleura: No pleural effusion. The lungs are clear. No mass in the region of the right paratracheal soft tissues to account for the abnormality on chest x-ray. The opacity was likely cause by mildly prominent vascular structures. Small calcified granuloma in the periphery of the right upper lobe.  Bones/Soft Tissues: No acute fracture or aggressive appearing lytic or blastic osseous lesion. Small sclerotic focus in the posterior aspect of the T7 vertebral body is nonspecific but likely representative of a benign bone island.  Upper Abdomen: Visualized upper abdominal organs are unremarkable.  IMPRESSION: 1. No acute cardiopulmonary process. 2. No pulmonary nodule or mass and no suspicious right paratracheal adenopathy. The abnormality on the chest x-ray likely represents prominence vascular structures. 3. Cardiomegaly with left ventricular dilatation. 4. Small pericardial effusion. 5. Atherosclerosis including coronary artery disease.   Electronically Signed   By: Malachy Moan M.D.   On: 10/05/2013 21:55   No results found for this basename: WBC, HGB, HCT, PLT,  in the last 72 hours No results found for this basename: NA, K, CL, CO, GLUCOSE, BUN, CREATININE, CALCIUM,  in the last 72 hours CBG (last 3)  No results found for this basename: GLUCAP,  in the last 72 hours  Wt Readings from Last 3 Encounters:  10/01/13 83.1 kg (183 lb 3.2 oz)  09/23/13 89.2 kg (196 lb 10.4 oz)    Physical Exam:  Constitutional:  He appears well-developed.  Mouth: dentition fair, oral mucosa pink/moist  Eyes: EOM are normal.  Neck: Normal range of motion. Neck supple. No thyromegaly present.   Cardiovascular: Normal rate and regular rhythm. No murmur or rubs  Respiratory: Diffuse wheezing noted and generally diminished air movement. No respiratory distress.  GI: Soft. Bowel sounds are normal. He exhibits no distension. Non-tender Neurological: He is alert.  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   RUE: 4+/5 shoulder, deltoid, bicep, tricep, grip, . RLE: 4/5 HF, KE, ADF/APF---some apraxia with motor efforts. Sensation intact to LT . DTR's 1+. Left side 5/5 strength Psych: pleasant and cooperative.  Skin: Skin is warm and dry. A few scattered abrasions   Assessment/Plan: 1. 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 Physiatrist is providing close team supervision and 24 hour management of active medical problems listed below. Physiatrist and rehab team continue to assess barriers to discharge/monitor patient progress toward functional and medical goals.  FIM: FIM - Bathing Bathing Steps Patient Completed: Chest;Right Arm;Left Arm;Abdomen;Front perineal area;Buttocks;Right upper leg;Left upper leg;Right lower leg (including foot);Left lower leg (including foot) Bathing: 6: More than reasonable amount of time  FIM - Upper Body Dressing/Undressing Upper body dressing/undressing steps patient completed: Thread/unthread right sleeve of pullover shirt/dresss;Thread/unthread left sleeve of pullover shirt/dress;Put head through opening of pull over shirt/dress;Pull shirt over trunk Upper body dressing/undressing: 7: Complete Independence: No helper FIM - Lower Body Dressing/Undressing Lower body dressing/undressing steps patient completed: Thread/unthread right pants leg;Thread/unthread left pants leg;Pull pants up/down;Don/Doff right sock;Don/Doff left sock;Don/Doff left shoe;Fasten/unfasten right shoe;Fasten/unfasten left shoe;Thread/unthread right  underwear leg;Thread/unthread left underwear leg;Pull underwear up/down;Don/Doff right shoe Lower body dressing/undressing: 7: Complete Independence: No helper  FIM - Toileting Toileting steps completed by patient: Adjust clothing prior to toileting;Performs perineal hygiene;Adjust clothing after toileting Toileting Assistive Devices: Grab bar or rail for support Toileting: 5: Supervision: Safety issues/verbal cues  FIM - Diplomatic Services operational officer Devices: Grab bars Toilet Transfers: 3-To toilet/BSC: Mod A (lift or lower assist);3-From toilet/BSC: Mod A (lift or lower assist)  FIM - Bed/Chair Transfer Bed/Chair Transfer Assistive Devices: Arm rests;Walker Bed/Chair Transfer: 5: Chair or W/C > Bed: Supervision (verbal cues/safety issues);5: Bed > Chair or W/C: Supervision (verbal cues/safety issues)  FIM - Locomotion: Wheelchair Distance: 150 Locomotion: Wheelchair: 0: Activity did not occur FIM - Locomotion: Ambulation Locomotion: Ambulation Assistive Devices: Designer, industrial/product Ambulation/Gait Assistance: 4: Min guard;3: Mod assist Locomotion: Ambulation: 4: Travels 150 ft or more with minimal assistance (Pt.>75%)  Comprehension Comprehension Mode: Auditory Comprehension: 5-Follows basic conversation/direction: With no assist  Expression Expression Mode: Verbal Expression: 5-Expresses basic needs/ideas: With extra time/assistive device  Social Interaction Social Interaction: 6-Interacts appropriately with others with medication or extra time (anti-anxiety, antidepressant).  Problem Solving Problem Solving Mode: Asleep Problem Solving: 4-Solves basic 75 - 89% of the time/requires cueing 10 - 24% of the time  Memory Memory Mode: Asleep Memory: 3-Recognizes or recalls 50 - 74% of the time/requires cueing 25 - 49% of the time  Medical Problem List and Plan:  1. 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 DVT  Prophylaxis/Anticoagulation: Pharmaceutical: Lovenox, plt a little low, monitor  3. Pain Management: Tylenol prn  4. Mood: Team to provide ego support. LCSW to follow for evaluation and support.  5. Neuropsych: This patient is capable of making decisions on his own behalf.  6. Skin/Wound Care: Routine pressure relief measures.  Patient able to reposition himself without difficulty.  7. HTN: Monitor every 8 hours. Continue Norvasc, lisinopril , apresoline and labetalol.difficult to manage adjust HCTZ  -bp's 140-150 range, increase HCTZ and decrease BB, monitor  - HR in 50-60s reduce BB 8. Tobacco abuse: 2 PPD smoker.  nicotine patch  . Albuterol inhaler prn.  CXR findings peritracheal mass, CT chest without evidence of neoplasm,  No evidence of PNA or bronchitis D/C levaquin, cont inhalers   LOS (Days) 11 A FACE TO FACE EVALUATION WAS PERFORMED  Derril Franek E 10/06/2013 6:45 AM

## 2013-10-06 NOTE — Progress Notes (Signed)
Occupational Therapy Session Note  Patient Details  Name: Jay Wise MRN: [id number] Date of Birth: 1961/01/12  Today's Date: 10/06/2013 OT Individual Time: 0800-0900 OT Individual Time Calculation (min): 60 min    Short Term Goals: Week 1:  OT Short Term Goal 1 (Week 1): Pt will attend to R side visual field to prevent injury during functional mobility and to locate ADL items with Min verbal cues. OT Short Term Goal 1 - Progress (Week 1): Met OT Short Term Goal 2 (Week 1): Pt will perform shower transfer with Min A in order to increase I in functional transfers. OT Short Term Goal 2 - Progress (Week 1): Met OT Short Term Goal 3 (Week 1): Pt will perform toileting with Min A in order to increase I in self care. OT Short Term Goal 3 - Progress (Week 1): Met OT Short Term Goal 4 (Week 1): Pt will perform toilet transfer with Min A in order to increase I in functional mobility. OT Short Term Goal 4 - Progress (Week 1): Met OT Short Term Goal 5 (Week 1): Pt will perform 10 minutes of dynamic standing balance activity with min A for balance in order to increase balance strategies for occupational performance.  OT Short Term Goal 5 - Progress (Week 1): Progressing toward goal Week 2:  OT Short Term Goal 1 (Week 2): STGs = LTGs due to discharge in less than 1 week  Skilled Therapeutic Interventions/Progress Updates:    1:1 GRAD DAY! Focus on completing self care tasks at shower level demonstrating safety with functional mobility and transfers, attention to right side, d/c  Planning, home set up, expression of needs and wants, dynamic standing balance and right UE strengthening and coordination. Pt with c/o right calf pain so only performed short distance ambulation due to discomfort. After showering transitioned into gym. Performed 9 hole peg test with right hand taking 28 seconds (norm 18.5). Performed stereoagnosis activity with different textured balls with eyes closed- able to complete task.  Also performed strengthening UB activities including wall push ups, stretching right UE with walking hand up and down the wall, and in sitting bicep, tricep and deltoid exercises holding large blue therapy ball. Returned to room to complete grooming tasks - attempted to complete in standing but due to fatigued requested to sit to complete tasks. Discussed having a chair in the bathroom to complete grooming tasks.    Therapy Documentation Precautions:  Precautions Precautions: Fall Precaution Comments: reduced sensation RLE Restrictions Weight Bearing Restrictions: No Pain: Pain Assessment Pain Score: 8  c/o pain in right calf- RN aware. Applied ice to calf at end of session - had pain meds prior to session.   See FIM for current functional status  Therapy/Group: Individual Therapy  [facility] 10/06/2013, 8:57 AM

## 2013-10-07 ENCOUNTER — Encounter (HOSPITAL_COMMUNITY): Payer: PRIVATE HEALTH INSURANCE | Admitting: Speech Pathology

## 2013-10-07 ENCOUNTER — Ambulatory Visit (HOSPITAL_COMMUNITY): Payer: PRIVATE HEALTH INSURANCE

## 2013-10-07 ENCOUNTER — Encounter (HOSPITAL_COMMUNITY): Payer: PRIVATE HEALTH INSURANCE | Admitting: Occupational Therapy

## 2013-10-07 MED ORDER — TRAMADOL HCL 50 MG PO TABS: 50.0000 mg | ORAL_TABLET | Freq: Four times a day (QID) | ORAL | Status: DC | PRN

## 2013-10-07 MED ORDER — LABETALOL HCL 200 MG PO TABS: 200.0000 mg | ORAL_TABLET | Freq: Two times a day (BID) | ORAL | Status: DC

## 2013-10-07 MED ORDER — LISINOPRIL 20 MG PO TABS: 20.0000 mg | ORAL_TABLET | Freq: Two times a day (BID) | ORAL | Status: DC

## 2013-10-07 MED ORDER — HYDROCHLOROTHIAZIDE 12.5 MG PO CAPS: 12.5000 mg | ORAL_CAPSULE | Freq: Every day | ORAL | Status: DC

## 2013-10-07 MED ORDER — HYDRALAZINE HCL 50 MG PO TABS: 50.0000 mg | ORAL_TABLET | Freq: Three times a day (TID) | ORAL | Status: DC

## 2013-10-07 MED ORDER — NICOTINE 14 MG/24HR TD PT24: 14.0000 mg | MEDICATED_PATCH | Freq: Every day | TRANSDERMAL | Status: DC

## 2013-10-07 MED ORDER — AMLODIPINE BESYLATE 10 MG PO TABS: 10.0000 mg | ORAL_TABLET | Freq: Every day | ORAL | Status: DC

## 2013-10-07 MED ORDER — PANTOPRAZOLE SODIUM 40 MG PO TBEC: 40.0000 mg | DELAYED_RELEASE_TABLET | Freq: Every day | ORAL | Status: DC

## 2013-10-07 MED ORDER — INFLUENZA VAC SPLIT QUAD 0.5 ML IM SUSY
0.5000 mL | PREFILLED_SYRINGE | Freq: Once | INTRAMUSCULAR | Status: AC
  Administered 2013-10-07: 0.5 mL via INTRAMUSCULAR
  Filled 2013-10-07 (×2): qty 0.5

## 2013-10-07 NOTE — Progress Notes (Signed)
Social Work Discharge Note Discharge Note  The overall goal for the admission was met for:   Discharge location: Yes-HOME WITH WIFE WHO IS AWARE OF THE 24 HR SUPERVISION LEVEL  Length of Stay: Yes-12 DAYS  Discharge activity level: Yes-SUPERVISION LEVEL  Home/community participation: Yes  Services provided included: MD, RD, PT, OT, SLP, RN, CM, TR, Pharmacy and SW  Financial Services: Private Insurance: [employer]  Follow-up services arranged: Outpatient: [facility]-OP-PT,OT,SP 9/23 FIRST APPT, DME: [facility], [facility] and Patient/Family has no preference for HH/DME agencies  Comments (or additional information):FAMILY EDUCATION COMPLETED DAY OF DISCHARGE AND WIFE AWARE OF RECOMMENDATION AND FEELS COMFORTABLE WITH HIS CARE AT HOME. NEW PCP SET UP FOR Friday APPOINTMENT  Patient/Family verbalized understanding of follow-up arrangements: Yes  Individual responsible for coordination of the follow-up plan: EMILY-WIFE  Confirmed correct DME delivered: Elease Hashimoto 10/07/2013    Elease Hashimoto

## 2013-10-07 NOTE — Progress Notes (Addendum)
[place] PHYSICAL MEDICINE & REHABILITATION     PROGRESS NOTE    Subjective/Complaints: No new issues overnite. Breathing OK Discussed D/C date and no driving A  review of systems has been performed and if not noted above is otherwise negative.   Objective: Vital Signs: Blood pressure 163/84, pulse 51, temperature 98 F (36.7 C), temperature source Oral, resp. rate 18, height  (1.854 m), weight 83.1 kg (183 lb 3.2 oz), SpO2 98.00%. Dg Chest 2 View  10/05/2013   CLINICAL DATA:  Followup pneumonia  EXAM: CHEST  2 VIEW  COMPARISON:  09/25/2013  FINDINGS: Cardiac shadow is within normal limits. The lungs are well aerated bilaterally. There is persistent density in the right peritracheal region when compare with the prior exam. Given this stability over 10 days further evaluation by means of CT of the chest is recommended. No other focal parenchymal abnormality is noted. The bony structures are within normal limits.  IMPRESSION: Persistent increased density in the right peritracheal region as described. CT of the chest with contrast is recommended for further evaluation.  These results will be called to the ordering clinician or representative by the Radiologist Assistant, and communication documented in the PACS or zVision Dashboard.   Electronically Signed   By: Alcide Clever M.D.   On: 10/05/2013 16:57   Ct Chest W Contrast  10/05/2013   CLINICAL DATA:  Abnormal chest x-ray  EXAM: CT CHEST WITH CONTRAST  TECHNIQUE: Multidetector CT imaging of the chest was performed during intravenous contrast administration.  CONTRAST:  OMNIPAQUE IOHEXOL 300 MG/ML  SOLN  COMPARISON:  Prior chest x-rays 10/05/2013 and 09/25/2013  FINDINGS: Mediastinum: Unremarkable appearance of the thyroid gland. The mediastinal lymph nodes are not enlarged by CT criteria but slightly conspicuous in number and likely reactive. Unremarkable thoracic esophagus. No mediastinal mass.  Heart/Vascular: Cardiomegaly with left  ventricular dilatation. Small pericardial effusion. Atherosclerotic calcifications present throughout the coronary arteries. Atherosclerotic plaque results in a mild narrowing of the origin of the left subclavian artery. Conventional 3 vessel arch anatomy. No aneurysmal dilatation or evidence of dissection.  Lungs/Pleura: No pleural effusion. The lungs are clear. No mass in the region of the right paratracheal soft tissues to account for the abnormality on chest x-ray. The opacity was likely cause by mildly prominent vascular structures. Small calcified granuloma in the periphery of the right upper lobe.  Bones/Soft Tissues: No acute fracture or aggressive appearing lytic or blastic osseous lesion. Small sclerotic focus in the posterior aspect of the T7 vertebral body is nonspecific but likely representative of a benign bone island.  Upper Abdomen: Visualized upper abdominal organs are unremarkable.  IMPRESSION: 1. No acute cardiopulmonary process. 2. No pulmonary nodule or mass and no suspicious right paratracheal adenopathy. The abnormality on the chest x-ray likely represents prominence vascular structures. 3. Cardiomegaly with left ventricular dilatation. 4. Small pericardial effusion. 5. Atherosclerosis including coronary artery disease.   Electronically Signed   By: Malachy Moan M.D.   On: 10/05/2013 21:55   No results found for this basename: WBC, HGB, HCT, PLT,  in the last 72 hours No results found for this basename: NA, K, CL, CO, GLUCOSE, BUN, CREATININE, CALCIUM,  in the last 72 hours CBG (last 3)  No results found for this basename: GLUCAP,  in the last 72 hours  Wt Readings from Last 3 Encounters:  10/01/13 83.1 kg (183 lb 3.2 oz)  09/23/13 89.2 kg (196 lb 10.4 oz)    Physical Exam:  Constitutional:  He appears well-developed.  Mouth: dentition fair, oral mucosa pink/moist  Eyes: EOM are normal.  Neck: Normal range of motion. Neck supple. No thyromegaly present.  Cardiovascular:  Normal rate and regular rhythm. No murmur or rubs  Respiratory: Diffuse wheezing noted and generally diminished air movement. No respiratory distress.  GI: Soft. Bowel sounds are normal. He exhibits no distension. Non-tender Neurological: He is alert.  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   RUE: 4+/5 shoulder, deltoid, bicep, tricep, grip, . RLE: 4/5 HF, KE, ADF/APF---some apraxia with motor efforts. Sensation intact to LT . DTR's 1+. Left side 5/5 strength Psych: pleasant and cooperative.  Skin: Skin is warm and dry. A few scattered abrasions   Assessment/Plan: 1. Functional deficits secondary to left thalamic hemorrhage Discussed PAD seen on Arterial dopplers Stable for D/C today F/u PCP in 1-2 weeks F/u PM&R 3 weeks See D/C summary See D/C instructionsFIM: FIM - Bathing Bathing Steps Patient Completed: Chest;Right Arm;Left Arm;Abdomen;Front perineal area;Buttocks;Right upper leg;Left upper leg;Right lower leg (including foot);Left lower leg (including foot) Bathing: 6: More than reasonable amount of time  FIM - Upper Body Dressing/Undressing Upper body dressing/undressing steps patient completed: Thread/unthread right sleeve of pullover shirt/dresss;Thread/unthread left sleeve of pullover shirt/dress;Put head through opening of pull over shirt/dress;Pull shirt over trunk Upper body dressing/undressing: 7: Complete Independence: No helper FIM - Lower Body Dressing/Undressing Lower body dressing/undressing steps patient completed: Thread/unthread right pants leg;Thread/unthread left pants leg;Pull pants up/down;Don/Doff right sock;Don/Doff left sock;Don/Doff left shoe;Fasten/unfasten right shoe;Fasten/unfasten left shoe;Thread/unthread right underwear leg;Thread/unthread left underwear leg;Pull underwear up/down;Don/Doff right shoe Lower body dressing/undressing: 7: Complete Independence: No helper  FIM - Toileting Toileting steps completed by patient: Adjust  clothing prior to toileting;Performs perineal hygiene;Adjust clothing after toileting Toileting Assistive Devices: Grab bar or rail for support Toileting: 6: More than reasonable amount of time  FIM - Diplomatic Services operational officer Devices: Grab bars Toilet Transfers: 6-More than reasonable amt of time  FIM - Banker Devices: Therapist, occupational: 6: Supine > Sit: No assist;6: Sit > Supine: No assist;5: Bed > Chair or W/C: Supervision (verbal cues/safety issues);5: Chair or W/C > Bed: Supervision (verbal cues/safety issues)  FIM - Locomotion: Wheelchair Distance: 150 Locomotion: Wheelchair: 0: Activity did not occur FIM - Locomotion: Ambulation Locomotion: Ambulation Assistive Devices: Walker - Rolling;Orthosis Ambulation/Gait Assistance: 5: Supervision Locomotion: Ambulation: 5: Travels 150 ft or more with supervision/safety issues  Comprehension Comprehension Mode: Auditory Comprehension: 5-Follows basic conversation/direction: With extra time/assistive device  Expression Expression Mode: Verbal Expression: 5-Expresses basic needs/ideas: With extra time/assistive device  Social Interaction Social Interaction: 5-Interacts appropriately 90% of the time - Needs monitoring or encouragement for participation or interaction.  Problem Solving Problem Solving Mode: Asleep Problem Solving: 4-Solves basic 75 - 89% of the time/requires cueing 10 - 24% of the time  Memory Memory Mode: Asleep Memory: 5-Recognizes or recalls 90% of the time/requires cueing < 10% of the time  Medical Problem List and Plan:  1. 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 DVT Prophylaxis/Anticoagulation: Pharmaceutical: Lovenox, plt a little low, monitor  3. Pain Management: Tylenol prn  4. Mood: Team to provide ego support. LCSW to follow for evaluation and support.  5. Neuropsych: This patient is capable of making  decisions on his own behalf.  6. Skin/Wound Care: Routine pressure relief measures. Patient able to reposition himself without difficulty.  7. HTN: Monitor every 8 hours. Continue Norvasc, lisinopril , apresoline and labetalol.difficult to manage adjust HCTZ  -  bp's 140-150 range, increase HCTZ and decrease BB, monitor  - HR in 50-60s asymptomatic, f/u PCP 8. Tobacco abuse: 2 PPD smoker.  nicotine patch  . Albuterol inhaler prn.  CXR findings peritracheal mass, CT chest without evidence of neoplasm,  No evidence of PNA or bronchitis D/C levaquin, cont inhalers 9.  PAD discussed smoking cessation - low fat diet, reimage if pt becomes symptomatic with claudication  LOS (Days) 12 A FACE TO FACE EVALUATION WAS PERFORMED  Shital Crayton E 10/07/2013 7:14 AM

## 2013-10-07 NOTE — Progress Notes (Signed)
Speech Language Pathology Discharge Summary  Patient Details  Name: Jay Wise MRN: [id number] Date of Birth: 1960-07-25  Today's Date: 10/07/2013 SLP Individual Time: 1130-1200 SLP Individual Time Calculation (min): 30 min   Skilled Therapeutic Interventions:  Pt was seen for skilled speech therapy targeting family training prior to discharge.  Upon arrival pt was seated upright in recliner, wife present for the duration of therapy session.   SLP provided pt's family with education related to communication strategies including providing extra time for communication, use of description, and providing pt with semantic/question cues to maximize pt's functional independence for communication.  SLP also provided pt and pt's wife with skilled compensatory strategies for memory emphasizing practicing new tasks over time, organizing the environment to facilitate improved recall of daily needs, and use of written aids.  Pt and pt's wife given handouts of strategy education to facilitate carryover in the home environment.  SLP also provided pt's wife with a list of cognitive reorganization activities to maximize remediation/compensation outside of therapy.  SLP reviewed and reinforced recommendations for 24/7 supervision for safety as well as assistance for medication and financial management.  Pt and wife in agreement with recommendations.      Patient has met 2 of 3 long term goals.  Patient to discharge at overall Min;Supervision level.  Reasons goals not met: pt benefits from min assist for reading comprehension at the sentence level, but impairments for written expression remain at the word level.     Clinical Impression/Discharge Summary:  Pt made functional progress while inpatient and is discharging having met 2 out of 3 long term goals due to improved comprehension of complex auditory information and verbal expression of semi-complex needs, wants, and ideas.   Pt currently requires min  assist-supervision for cognitive-linguistic tasks due to word finding deficits, decreased recall of new information, impulsivity and decreased safety awareness, and decreased functional problem solving.  Pt is discharging home with 24/7 supervision and assistance for medication and financial management from family.  Pt would benefit from follow up ST services upon discharge to maximize functional independence and reduce burden of care upon discharge.  Pt and family education is complete at this time.    Care Partner:  Caregiver Able to Provide Assistance: Yes  Type of Caregiver Assistance: Physical;Cognitive  Recommendation:  24 hour supervision/assistance;Outpatient SLP  Rationale for SLP Follow Up: Maximize functional communication;Maximize cognitive function and independence;Reduce caregiver burden   Equipment: none recommended by SLP    Reasons for discharge: Discharged from hospital   Patient/Family Agrees with Progress Made and Goals Achieved: Yes   See FIM for current functional status  Windell Moulding, M.A. CCC-SLP  Obed Samek, Selinda Orion 10/07/2013, 12:41 PM

## 2013-10-07 NOTE — Progress Notes (Signed)
Occupational Therapy Discharge Summary  Patient Details  Name: Jay Wise MRN: [id number] Date of Birth: [birth year]   Patient has met 21 of 11 long term goals due to improved activity tolerance, improved balance, postural control, ability to compensate for deficits, functional use of  RIGHT upper and RIGHT lower extremity, improved attention, improved awareness and improved coordination.  Patient to discharge at overall supervision to  Modified Independent level.  Patient's care partner is independent to provide the necessary physical and cognitive assistance at discharge.    Reasons goals not met: n/a  Recommendation:  Patient will benefit from ongoing skilled OT services in outpatient setting to continue to advance functional skills in the area of iADL and Vocation.  Equipment: shower chair  Reasons for discharge: treatment goals met  Patient/family agrees with progress made and goals achieved: Yes  OT Discharge  ADL ADL ADL Comments: mod I self care except for S to step into shower stall Vision/Perception  Vision- History Baseline Vision/History: No visual deficits Patient Visual Report: No change from baseline Vision- Assessment Eye Alignment: Within Functional Limits  Cognition Overall Cognitive Status: Impaired/Different from baseline Orientation Level: Oriented X4 Memory: Impaired Memory Impairment: Storage deficit;Retrieval deficit Sensation Sensation Light Touch: Appears Intact (intact for RUE, but pt c/o intermittent numbness) Stereognosis: Appears Intact Hot/Cold: Appears Intact Proprioception: Appears Intact Coordination Coordination and Movement Description: Pt with decreased accuracy and timing with RLE/UE 9 Hole Peg Test: 28 seconds on R hand (versus norm of 18.5) Motor  Motor Motor: Hemiplegia;Motor impersistence Motor - Discharge Observations: Pt wtih good RLE strength, bu difficulty incorporating coordination an daccuracy during functional tasks  (toe catches often with gait an dstairs)  Mobility    mod I to toilet with RW, supervision to shower (recommending to family to provide 24/7 S for the first week he his home and then slowly decrease the amount of S for safety) Trunk/Postural Assessment  Cervical Assessment Cervical Assessment: Within Functional Limits Thoracic Assessment Thoracic Assessment: Within Functional Limits Lumbar Assessment Lumbar Assessment: Within Functional Limits Postural Control Postural Control: Within Functional Limits  Balance Static Sitting Balance Static Sitting - Level of Assistance: 7: Independent Dynamic Sitting Balance Dynamic Sitting - Level of Assistance: 7: Independent Static Standing Balance Static Standing - Level of Assistance: 6: Modified independent (Device/Increase time) (with RW support) Dynamic Standing Balance Dynamic Standing - Level of Assistance: 6: Modified independent (Device/Increase time) (during ADLs with RW support) Extremity/Trunk Assessment RUE Assessment RUE Assessment: Exceptions to [facility] (decreased [facility] for rapid movements) RUE Strength RUE Overall Strength: Deficits (5-/5) Grip (lbs): 95 Lateral Pinch: 23 lbs LUE Assessment LUE Assessment: Within Functional Limits  See FIM for current functional status  Maicy Filip 10/07/2013, 12:14 PM

## 2013-10-07 NOTE — Progress Notes (Signed)
Occupational Therapy Session Note  Patient Details  Name: Jay Wise MRN: [id number] Date of Birth: 1960-11-29  Today's Date: 10/07/2013 OT Individual Time: 5974-1638 OT Individual Time Calculation (min): 43 min    Short Term Goals: Week 1:  OT Short Term Goal 1 (Week 1): Pt will attend to R side visual field to prevent injury during functional mobility and to locate ADL items with Min verbal cues. OT Short Term Goal 1 - Progress (Week 1): Met OT Short Term Goal 2 (Week 1): Pt will perform shower transfer with Min A in order to increase I in functional transfers. OT Short Term Goal 2 - Progress (Week 1): Met OT Short Term Goal 3 (Week 1): Pt will perform toileting with Min A in order to increase I in self care. OT Short Term Goal 3 - Progress (Week 1): Met OT Short Term Goal 4 (Week 1): Pt will perform toilet transfer with Min A in order to increase I in functional mobility. OT Short Term Goal 4 - Progress (Week 1): Met OT Short Term Goal 5 (Week 1): Pt will perform 10 minutes of dynamic standing balance activity with min A for balance in order to increase balance strategies for occupational performance.  OT Short Term Goal 5 - Progress (Week 1): Progressing toward goal Week 2:  OT Short Term Goal 1 (Week 2): STGs = LTGs due to discharge in less than 1 week  Skilled Therapeutic Interventions/Progress Updates:    Pt seen this session for family education with pt's wife focusing on pt's level of I with self care and IADLS. Reviewed his progress from admission, areas he needs to continue to work on, recommendations for out pt therapy, shower chair and bathroom chair recommendations, level of S needed for various activities.  To demonstrate pt's functional mobility with IADLs, pt ambulated with RW to kitchen to practice reaching into cupboards with R hand while lunging forward, squatting down to remove heavy pots and pans from lower cabinets, and making the bed in the ADL apartment. He  practiced sitting down and standing to low bed and low couch. Pt's wife very pleased with his progress. They ambulated back to his room to wait for the next therapy session.  Therapy Documentation Precautions:  Precautions Precautions: Fall Precaution Comments: reduced sensation RLE Restrictions Weight Bearing Restrictions: No   Vital Signs: Therapy Vitals Pulse Rate: 68 BP: 119/62 mmHg Pain: Pain Assessment Pain Assessment: No/denies pain  ADL: See FIM for current functional status  Therapy/Group: Individual Therapy  Danyela Posas 10/07/2013, 11:56 AM

## 2013-10-07 NOTE — Plan of Care (Signed)
Problem: RH Other (Specify) Goal: RH LTG Other (Specify) Outcome: Progressing Partially met, pt benefits from min assist for reading comprehension at the sentence level but continues with impairments of written expression at the word level.

## 2013-10-07 NOTE — Progress Notes (Signed)
Patient and wife given discharge instructions from [facility]; all questions answered.  Patient and wife escorted to car by [place], [place].

## 2013-10-07 NOTE — Discharge Instructions (Signed)
Inpatient Rehab Discharge Instructions  Jay Wise Discharge date and time:  10/07/13  Activities/Precautions/ Functional Status: Activity: activity as tolerated. Diet: cardiac diet Wound Care: none needed  Functional status:  ___ No restrictions     ___ Walk up steps independently _X__ 24/7 supervision/assistance   ___ Walk up steps with assistance ___ Intermittent supervision/assistance  ___ Bathe/dress independently ___ Walk with walker     ___ Bathe/dress with assistance ___ Walk Independently    ___ Shower independently ___ Walk with assistance    ___ Shower with assistance _X__ No alcohol     ___ Return to work/school ________  Special Instructions: 1. NO driving till cleared by Dr. Wynn Banker or Dr. Roda Shutters.  2.  NO smoking--you have impaired circulation in both legs. Follow up with MD for monitoring.   COMMUNITY REFERRALS UPON DISCHARGE:    Outpatient: PT, OT, SP  Agency:[facility] Phone:[phone] Date of Last Service:10/07/2013  Appointment Date/Time:CALL WIFE TO SET UP FIRST APPOINTMENT  Medical Equipment/Items Ordered:ROLLING Jay Wise SEAT  Agency/Supplier:[facility]    [phone]   GENERAL COMMUNITY RESOURCES FOR PATIENT/FAMILY: Support Groups:[facility] SUPPORT GROUP   STROKE/TIA DISCHARGE INSTRUCTIONS SMOKING Cigarette smoking nearly doubles your risk of having a stroke & is the single most alterable risk factor  If you smoke or have smoked in the last 12 months, you are advised to quit smoking for your health.  Most of the excess cardiovascular risk related to smoking disappears within a year of stopping.  Ask you doctor about anti-smoking medications  Jay Wise Quit Line: 1-800-QUIT NOW  Free Smoking Cessation Classes [phone]  CHOLESTEROL Know your levels; limit fat & cholesterol in your diet  Lipid Panel     Component Value Date/Time   CHOL 169 09/23/2013 0741   TRIG 78 09/23/2013 0741   HDL 44 09/23/2013 0741   CHOLHDL 3.8 09/23/2013 0741   VLDL  16 09/23/2013 0741   LDLCALC 109* 09/23/2013 0741      Many patients benefit from treatment even if their cholesterol is at goal.  Goal: Total Cholesterol (CHOL) less than 160  Goal:  Triglycerides (TRIG) less than 150  Goal:  HDL greater than 40  Goal:  LDL (LDLCALC) less than 100   BLOOD PRESSURE American Stroke Association blood pressure target is less that 120/80 mm/Hg  Your discharge blood pressure is:  BP: 163/85 mmHg  Monitor your blood pressure  Limit your salt and alcohol intake  Many individuals will require more than one medication for high blood pressure  DIABETES (A1c is a blood sugar average for last 3 months) Goal HGBA1c is under 7% (HBGA1c is blood sugar average for last 3 months)  Diabetes: No known diagnosis of diabetes    Lab Results  Component Value Date   HGBA1C 5.6 09/23/2013     Your HGBA1c can be lowered with medications, healthy diet, and exercise.  Check your blood sugar as directed by your physician  Call your physician if you experience unexplained or low blood sugars.  PHYSICAL ACTIVITY/REHABILITATION Goal is 30 minutes at least 4 days per week  Activity: No driving, Therapies: See above Return to work:  To be decided on follow up  Activity decreases your risk of heart attack and stroke and makes your heart stronger.  It helps control your weight and blood pressure; helps you relax and can improve your mood.  Participate in a regular exercise program.  Talk with your doctor about the best form of exercise for you (dancing,  walking, swimming, cycling).  DIET/WEIGHT Goal is to maintain a healthy weight  Your discharge diet is: Cardiac thin liquids Your height is:  Height:  (185.4 cm) Your current weight is: Weight: 87.408 kg (192 lb 11.2 oz) Your Body Mass Index (BMI) is:  BMI (Calculated): 25.5  Following the type of diet specifically designed for you will help prevent another stroke.  Your goal weight is:  189 lbs  Your goal Body Mass  Index (BMI) is 19-24.  Healthy food habits can help reduce 3 risk factors for stroke:  High cholesterol, hypertension, and excess weight.  RESOURCES Stroke/Support Group:  Call [phone]   STROKE EDUCATION PROVIDED/REVIEWED AND GIVEN TO PATIENT Stroke warning signs and symptoms How to activate emergency medical system (call 911). Medications prescribed at discharge. Need for follow-up after discharge. Personal risk factors for stroke. Pneumonia vaccine given:  Flu vaccine given:  My questions have been answered, the writing is legible, and I understand these instructions.  I will adhere to these goals & educational materials that have been provided to me after my discharge from the hospital.      My questions have been answered and I understand these instructions. I will adhere to these goals and the provided educational materials after my discharge from the hospital.  Patient/Caregiver Signature _______________________________ Date __________  Clinician Signature _______________________________________ Date __________  Please bring this form and your medication list with you to all your follow-up doctor's appointments.

## 2013-10-07 NOTE — Progress Notes (Signed)
Physical Therapy Session Note  Patient Details  Name: Jay Wise MRN: [id number] Date of Birth: 1960/12/29  Today's Date: 10/07/2013 PT Individual Time: 8295-6213 PT Individual Time Calculation (min): 45 min   Short Term Goals: Week 2:  PT Short Term Goal 1 (Week 2): STG=LTG due to LOS, S overall     Skilled Therapeutic Interventions/Progress Updates:  Family ed with wife.  She return demonstrated:   safe guarding of pt's transfers, gait over level tile and carpet, up/down flight of steps with R rail ascending, up/down ramp and curb,  folding RW, safety concerns as pt fatigues or ambulates too fast, purpose of R AFO and modified shoe (toe cap), floor transfer.  PT discussed  importance of smoking cessation and life-long fitness.    Therapy Documentation Precautions:  Precautions Precautions: Fall Precaution Comments: reduced sensation RLE Restrictions Weight Bearing Restrictions: No   Pain: Pain Assessment Pain Assessment: No/denies pain   Locomotion : Ambulation Ambulation/Gait Assistance: 5: Supervision  Trunk/Postural Assessment : Cervical Assessment Cervical Assessment: Within Functional Limits Thoracic Assessment Thoracic Assessment: Within Functional Limits Lumbar Assessment Lumbar Assessment: Within Functional Limits Postural Control Postural Control: Within Functional Limits  Balance: Static Sitting Balance Static Sitting - Level of Assistance: 7: Independent Dynamic Sitting Balance Dynamic Sitting - Level of Assistance: 7: Independent Static Standing Balance Static Standing - Level of Assistance: 6: Modified independent (Device/Increase time) (with RW support) Dynamic Standing Balance Dynamic Standing - Level of Assistance: 6: Modified independent (Device/Increase time) (during ADLs with RW support)     See FIM for current functional status  Therapy/Group: Individual Therapy  [name] 10/07/2013, 1:54 PM

## 2013-10-07 NOTE — Discharge Summary (Signed)
Physician Discharge Summary  Patient ID: Jay Wise MRN: [id number] DOB/AGE: 09/14/1960 53 y.o.  Admit date: 09/25/2013 Discharge date: 10/07/2013  Discharge Diagnoses:  Principal Problem:   CVA (cerebral infarction) Active Problems:   HTN (hypertension), malignant   Tobacco use disorder   Dehydration   Thrombocytopenia, unspecified   PAD (peripheral artery disease)   Discharged Condition: Stable  Significant Diagnostic Studies: Dg Chest 2 View  10/05/2013   CLINICAL DATA:  Followup pneumonia  EXAM: CHEST  2 VIEW  COMPARISON:  09/25/2013  FINDINGS: Cardiac shadow is within normal limits. The lungs are well aerated bilaterally. There is persistent density in the right peritracheal region when compare with the prior exam. Given this stability over 10 days further evaluation by means of CT of the chest is recommended. No other focal parenchymal abnormality is noted. The bony structures are within normal limits.  IMPRESSION: Persistent increased density in the right peritracheal region as described. CT of the chest with contrast is recommended for further evaluation.  These results will be called to the ordering clinician or representative by the Radiologist Assistant, and communication documented in the PACS or zVision Dashboard.   Electronically Signed   By: Alcide Clever M.D.   On: 10/05/2013 16:57   Ct Chest W Contrast  10/05/2013   CLINICAL DATA:  Abnormal chest x-ray  EXAM: CT CHEST WITH CONTRAST  TECHNIQUE: Multidetector CT imaging of the chest was performed during intravenous contrast administration.  CONTRAST:  OMNIPAQUE IOHEXOL 300 MG/ML  SOLN  COMPARISON:  Prior chest x-rays 10/05/2013 and 09/25/2013  FINDINGS: Mediastinum: Unremarkable appearance of the thyroid gland. The mediastinal lymph nodes are not enlarged by CT criteria but slightly conspicuous in number and likely reactive. Unremarkable thoracic esophagus. No mediastinal mass.  Heart/Vascular: Cardiomegaly with left  ventricular dilatation. Small pericardial effusion. Atherosclerotic calcifications present throughout the coronary arteries. Atherosclerotic plaque results in a mild narrowing of the origin of the left subclavian artery. Conventional 3 vessel arch anatomy. No aneurysmal dilatation or evidence of dissection.  Lungs/Pleura: No pleural effusion. The lungs are clear. No mass in the region of the right paratracheal soft tissues to account for the abnormality on chest x-ray. The opacity was likely cause by mildly prominent vascular structures. Small calcified granuloma in the periphery of the right upper lobe.  Bones/Soft Tissues: No acute fracture or aggressive appearing lytic or blastic osseous lesion. Small sclerotic focus in the posterior aspect of the T7 vertebral body is nonspecific but likely representative of a benign bone island.  Upper Abdomen: Visualized upper abdominal organs are unremarkable.  IMPRESSION: 1. No acute cardiopulmonary process. 2. No pulmonary nodule or mass and no suspicious right paratracheal adenopathy. The abnormality on the chest x-ray likely represents prominence vascular structures. 3. Cardiomegaly with left ventricular dilatation. 4. Small pericardial effusion. 5. Atherosclerosis including coronary artery disease.   Electronically Signed   By: Malachy Moan M.D.   On: 10/05/2013 21:55    Labs:  Basic Metabolic Panel: No results found for this basename: NA, K, CL, CO2, GLUCOSE, BUN, CREATININE, CALCIUM, MG, PHOS,  in the last 168 hours  CBC Latest Ref Rng 09/30/2013 09/28/2013 09/22/2013  WBC 4.0 - 10.5 K/uL 5.5 5.9 6.9  Hemoglobin 13.0 - 17.0 g/dL 40.9 81.1 91.4  Hematocrit 39.0 - 52.0 % 45.8 44.2 47.3  Platelets 150 - 400 K/uL 127(L) 123(L) 110(L)      CBG: No results found for this basename: GLUCAP,  in the last 168 hours  Brief HPI:  Jay Wise is a 53 y.o. right handed male with history of untreated hypertension, tobacco abuse; who was admitted 09/22/2013 with  right-sided weakness and aphasia. Systolic blood pressure 210 at admisison and CT of the brain in the left thalamus 3.3 x 1.7 cm intraventricular extension and mild left-to-right midline shift but no hydrocephalus. Followup cranial CT scan 09/23/2013 unchanged. Dr. 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 Echocardiogram with ejection fraction 55% no wall motion abnormalities. Carotid dopplers without ICA stenosis. Renal artery duplex without RAS but L-RA not insonated.  Patient with moderate expressive aphasia with difficulty storing new information. He continues to have resultant right sided weakness, cognitive and perceptual deficits as well as dysarthria. CIR was recommended by MD and Rehab team.    Hospital Course: Jay Wise was admitted to rehab 09/25/2013 for inpatient therapies to consist of PT, ST and OT at least three hours five days a week. Past admission physiatrist, therapy team and rehab RN have worked together to provide customized collaborative inpatient rehab. Blood pressures were monitored every 8 hours and medications were titrated for better control. Follow up labs done showed worsening of renal status with BUN/Cr rising to 29/1.5. He was encouraged to push po fluids with improvement in renal status. Labetalol was adjusted due to bradycardia. He was noted to have significant cough at admission and CXR showed question of PNA v/s bronchitis. He was treated with levaquin x 7 days with resolution of symptoms. Follow up CXR question peritracheal mass therefore follow CT of chest was done. This revealed prominent vascular structure and no masses seen.  Nicotine patch was added to help with withdrawal symptoms.  Patient and wife were educated on tobacco cessation and to continue slow nicotine taper past discharge.   ABI were checked due to decrease in pedal pulses and these revealed moderate to severe disease in BLE. Patient was advised to follow up  with MD for now with recommendations to monitor for ulceration, onset of severe pain or coolness of limb as well as claudication. 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 RUE strength has improved to 4+/5 at shoulder, deltoid, biceps, triceps and grip. RLE 4/5 HF, KE, ADF/APF.  He was fitted with Toe off AFO as well as shoe toe cap to help with gait quality as well as foot clearance due to weak dorsiflexors. He has progressed to supervision level and will continue to receive outpatient PT, OT and ST at [facility].     Rehab course: During patient's stay in rehab weekly team conferences were held to monitor patient's progress, set goals and discuss barriers to discharge. Patient has had improvement in activity tolerance, balance, postural control, as well as ability to compensate for deficits. He is has had improvement in functional use RUE  and RLE as well as improved awareness of deficits. BERG balance score has improved from 30 to 46/56 but he is still at significant fall risk requiring RW at all times as well as supervision for safety. He is able to ambulate 150 + 150 feet in controlled environment as well as navigating a flight of stairs with very close supervision. Speech therapy has focused on aphasia as well as cognitive deficits.  He is using compensatory speech strategies with supervision. He requires min assist with supervision for semi-complex tasks, working memory as well as functional word finding deficits. Family education was done with wife regarding all aspects of ADLs , mobility as well as communication strategies  and need for medication and financial assistance.     Disposition: 01-Home or Self Care  Diet: Cardiac   Special Instructions: 1. NO driving till cleared by Dr. Wynn Banker or Dr. Roda Shutters.  2.  NO smoking--you have impaired circulation in both legs. Follow up with MD for monitoring.       Medication List    STOP taking these medications       heparin  5000 UNIT/ML injection     sennosides-docusate sodium 8.6-50 MG tablet  Commonly known as:  SENOKOT-S      TAKE these medications       amLODipine 10 MG tablet  Commonly known as:  NORVASC  Take 1 tablet (10 mg total) by mouth daily with breakfast. For blood pressure     hydrALAZINE 50 MG tablet  Commonly known as:  APRESOLINE  Take 1 tablet (50 mg total) by mouth 3 (three) times daily. For blood pressure     hydrochlorothiazide 12.5 MG capsule  Commonly known as:  MICROZIDE  Take 1 capsule (12.5 mg total) by mouth daily. For blood pressure     labetalol 200 MG tablet  Commonly known as:  NORMODYNE  Take 1 tablet (200 mg total) by mouth 2 (two) times daily. With breakfast and supper.  For blood pressure     lisinopril 20 MG tablet  Commonly known as:  PRINIVIL,ZESTRIL  Take 1 tablet (20 mg total) by mouth 2 (two) times daily. With breakfast and supper--for blood pressure.     nicotine 14 mg/24hr patch  Commonly known as:  NICODERM CQ - dosed in mg/24 hours  Place 1 patch (14 mg total) onto the skin daily.     pantoprazole 40 MG tablet  Commonly known as:  PROTONIX  Take 1 tablet (40 mg total) by mouth at bedtime. For indigestion.     traMADol 50 MG tablet  Commonly known as:  ULTRAM  Take 1 tablet (50 mg total) by mouth 4 (four) times daily as needed for moderate pain.       Follow-up Information   Follow up with Erick Colace, MD On 11/03/2013. (Be there at 10 am  for 10:30 am  appointment)    Specialty:  Physical Medicine and Rehabilitation   Contact information:   [address] [place] [address] [phone]       Follow up with Xu,Jindong, MD. Call today. (for follow up appointment)    Specialty:  Neurology   Contact information:   [address] [place] [address] [phone]       Follow up with Dorothey Baseman, MD On 10/09/2013. (APPOINTMENT FRIDAY 9/18 (per family))    Specialty:  Family Medicine   Contact  information:   [address] [place] [address] [phone]       Follow up with Sherren Kerns, MD.   Specialty:  Vascular Surgery   Contact information:   [address] [place] [address] [phone]       Signed: Jacquelynn Cree 10/12/2013, 3:52 PM

## 2013-10-08 NOTE — Progress Notes (Signed)
Recreational Therapy Discharge Summary Patient Details  Name: Jay Wise MRN: [id number] Date of Birth: 11/04/1960 Today's Date: 10/08/2013  Long term goals set: 1  Long term goals met: 1  Comments on progress toward goals: Pt has made great progress during LOS and is discharging home with wife to provide/coordinate 24 hour supervision.   Reasons for discharge: discharge from hospital Patient/family agrees with progress made and goals achieved: Yes  SIMPSON,LISA 10/08/2013, 8:09 AM     

## 2013-10-14 ENCOUNTER — Encounter: Payer: Self-pay | Admitting: Physical Medicine & Rehabilitation

## 2013-10-22 ENCOUNTER — Encounter: Payer: Self-pay | Admitting: Physical Medicine & Rehabilitation

## 2013-11-03 ENCOUNTER — Encounter: Payer: PRIVATE HEALTH INSURANCE | Attending: Physical Medicine & Rehabilitation

## 2013-11-03 ENCOUNTER — Ambulatory Visit (HOSPITAL_BASED_OUTPATIENT_CLINIC_OR_DEPARTMENT_OTHER): Payer: PRIVATE HEALTH INSURANCE | Admitting: Physical Medicine & Rehabilitation

## 2013-11-03 ENCOUNTER — Encounter: Payer: Self-pay | Admitting: Physical Medicine & Rehabilitation

## 2013-11-03 VITALS — BP 100/69 | HR 64 | Resp 14 | Ht 72.0 in | Wt 186.0 lb

## 2013-11-03 DIAGNOSIS — I619 Nontraumatic intracerebral hemorrhage, unspecified: Secondary | ICD-10-CM

## 2013-11-03 DIAGNOSIS — I69398 Other sequelae of cerebral infarction: Secondary | ICD-10-CM | POA: Insufficient documentation

## 2013-11-03 DIAGNOSIS — I69359 Hemiplegia and hemiparesis following cerebral infarction affecting unspecified side: Secondary | ICD-10-CM

## 2013-11-03 NOTE — Progress Notes (Signed)
Subjective:    Patient ID: Jay Wise, male    DOB: 1960/03/12, 53 y.o.   MRN: [id number]  53 y.o. right handed male with history of untreated hypertension, tobacco abuse; who was admitted 09/22/2013 with right-sided weakness and aphasia. Systolic blood pressure 210 at admisison. CT of the brain in the left thalamus 3.3 x 1.7 cm intraventricular extension and mild left-to-right midline shift but no hydrocephalus. Followup cranial CT scan 09/23/2013 unchanged. Dr. 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 Echocardiogram with ejection fraction 55% no wall motion abnormalities. Carotid dopplers without ICA stenosis Admit date: 09/25/2013 Discharge date: 10/07/2013  HPI  Has been getting outpatient therapy, has not used cane or walker for at least one week. Ambulating outside without assisted device Has not resumed smoking No falls Follows up with neurology next week Memory seems to be improving Sensation still a problem on right  Pain Inventory Average Pain 0 Pain Right Now 0 My pain is no pain  In the last 24 hours, has pain interfered with the following? General activity 0 Relation with others 0 Enjoyment of life 0 What TIME of day is your pain at its worst? N/A Sleep (in general) Good  Pain is worse with: some activites Pain improves with: rest Relief from Meds: 0  Mobility walk without assistance how many minutes can you walk? 10 - 15 minutes ability to climb steps?  yes do you drive?  no  Function employed # of hrs/week 40, not right now  Neuro/Psych weakness loss of taste or smell  Prior Studies Any changes since last visit?  no  Physicians involved in your care Any changes since last visit?  no   Family History  Problem Relation Age of Onset  . Cancer Father    History   Social History  . Marital Status: Married    Spouse Name: N/A    Number of Children: N/A  . Years of Education: N/A   Social  History Main Topics  . Smoking status: Former Smoker    Types: Cigarettes    Quit date: 09/18/2013  . Smokeless tobacco: None  . Alcohol Use: Yes  . Drug Use: No  . Sexual Activity: None   Other Topics Concern  . None   Social History Narrative  . None   History reviewed. No pertinent past surgical history. Past Medical History  Diagnosis Date  . Hypertension   . Asthma   . Stroke    BP 100/69  Pulse 64  Resp 14  Ht 6' (1.829 m)  Wt 186 lb (84.369 kg)  BMI 25.22 kg/m2  SpO2 100%  Opioid Risk Score:   Fall Risk Score: Low Fall Risk (0-5 points)   Review of Systems     Objective:   Physical Exam 5/5 bilateral deltoid, biceps, triceps, grip, hip flexor, knee extensors, ankle dorsi flexion plantar flexor Gait unable to do tandem gait. He is able to do heel walk and toe walk. Sensation reduced in the right hand but is able to identify which fingers are touched.  Cognition: Memory 1/3 unrelated objects after 2 minute delay Speech without evidence of dysarthria  Mood and affect without lability or agitation  General no acute distress   Visual fields are intact to confrontation testing  Vitals reviewed  Nursing note review  Musculoskeletal: No pain with bilateral shoulder range of motion.     Assessment & Plan:  1. 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 Patient continues with outpatient therapy PT OT and speech Emphasize need for home exercise program particularly cognitive tasks, recommended memory games board games to play with his children. Reading encouraged as well  We discussed recovery time course as well as return to work as well as return to driving. At the current time I do not think he is ready for driving.  Followup in 6 weeks Discussed all this with the patient as well as his wife Over half  of the 25 min visit was spent counseling and coordinating care.

## 2013-11-09 ENCOUNTER — Ambulatory Visit (INDEPENDENT_AMBULATORY_CARE_PROVIDER_SITE_OTHER): Payer: PRIVATE HEALTH INSURANCE | Admitting: Nurse Practitioner

## 2013-11-09 ENCOUNTER — Encounter: Payer: Self-pay | Admitting: Nurse Practitioner

## 2013-11-09 VITALS — BP 131/76 | HR 65 | Temp 97.0°F | Ht 70.0 in | Wt 188.0 lb

## 2013-11-09 DIAGNOSIS — R413 Other amnesia: Secondary | ICD-10-CM

## 2013-11-09 DIAGNOSIS — I619 Nontraumatic intracerebral hemorrhage, unspecified: Secondary | ICD-10-CM

## 2013-11-09 DIAGNOSIS — I69359 Hemiplegia and hemiparesis following cerebral infarction affecting unspecified side: Secondary | ICD-10-CM

## 2013-11-09 DIAGNOSIS — I69398 Other sequelae of cerebral infarction: Secondary | ICD-10-CM

## 2013-11-09 NOTE — Patient Instructions (Signed)
Continue outpatient therapies, and after they are finished continue the exercises they teach you.  Your memory will improve with time.  You can use computer games such as elevate, an app on the phone or tablet to stimulate your brain.  We performed a baseline memory test today.  We can recheck it at future visits to see if there is any change.  Continue to take your blood pressure medicines.  This will be managed by your Primary care MD.  Follow up with Dr. Wynn BankerKirsteins. Please do not drive until he sees you in followup.  Follow up with Dr. Roda ShuttersXu in 3 months, sooner as needed.     Stroke Prevention Some medical conditions and behaviors are associated with an increased chance of having a stroke. You may prevent a stroke by making healthy choices and managing medical conditions. HOW CAN I REDUCE MY RISK OF HAVING A STROKE?   Stay physically active. Get at least 30 minutes of activity on most or all days.  Do not smoke. It may also be helpful to avoid exposure to secondhand smoke.  Limit alcohol use. Moderate alcohol use is considered to be:  No more than 2 drinks per day for men.  No more than 1 drink per day for nonpregnant women.  Eat healthy foods. This involves:  Eating 5 or more servings of fruits and vegetables a day.  Making dietary changes that address high blood pressure (hypertension), high cholesterol, diabetes, or obesity.  Manage your cholesterol levels.  Making food choices that are high in fiber and low in saturated fat, trans fat, and cholesterol may control cholesterol levels.  Take any prescribed medicines to control cholesterol as directed by your health care provider.  Manage your diabetes.  Controlling your carbohydrate and sugar intake is recommended to manage diabetes.  Take any prescribed medicines to control diabetes as directed by your health care provider.  Control your hypertension.  Making food choices that are low in salt (sodium), saturated fat,  trans fat, and cholesterol is recommended to manage hypertension.  Take any prescribed medicines to control hypertension as directed by your health care provider.  Maintain a healthy weight.  Reducing calorie intake and making food choices that are low in sodium, saturated fat, trans fat, and cholesterol are recommended to manage weight.  Stop drug abuse.  Avoid taking birth control pills.  Talk to your health care provider about the risks of taking birth control pills if you are over 55 years old, smoke, get migraines, or have ever had a blood clot.  Get evaluated for sleep disorders (sleep apnea).  Talk to your health care provider about getting a sleep evaluation if you snore a lot or have excessive sleepiness.  Take medicines only as directed by your health care provider.  For some people, aspirin or blood thinners (anticoagulants) are helpful in reducing the risk of forming abnormal blood clots that can lead to stroke. If you have the irregular heart rhythm of atrial fibrillation, you should be on a blood thinner unless there is a good reason you cannot take them.  Understand all your medicine instructions.  Make sure that other conditions (such as anemia or atherosclerosis) are addressed. SEEK IMMEDIATE MEDICAL CARE IF:   You have sudden weakness or numbness of the face, arm, or leg, especially on one side of the body.  Your face or eyelid droops to one side.  You have sudden confusion.  You have trouble speaking (aphasia) or understanding.  You have sudden trouble  seeing in one or both eyes.  You have sudden trouble walking.  You have dizziness.  You have a loss of balance or coordination.  You have a sudden, severe headache with no known cause.  You have new chest pain or an irregular heartbeat. Any of these symptoms may represent a serious problem that is an emergency. Do not wait to see if the symptoms will go away. Get medical help at once. Call your local  emergency services (911 in [place] Do not drive yourself to the hospital. Document Released: 02/16/2004 Document Revised: 05/25/2013 Document Reviewed: 07/11/2012 [facility] Patient Information 2015 [employer] [place]. This information is not intended to replace advice given to you by your health care provider. Make sure you discuss any questions you have with your health care provider.

## 2013-11-09 NOTE — Progress Notes (Signed)
PATIENT: Jay Wise DOB: 06-Oct-1960  REASON FOR VISIT: hospital follow up for stroke HISTORY FROM: patient  HISTORY OF PRESENT ILLNESS: 53 y.o. right handed male with history of untreated hypertension, tobacco abuse; who was admitted 09/22/2013 with right-sided weakness and aphasia. Systolic blood pressure BP on arrival 242/148 at [facility]. No home meds. CT of the brain in the left thalamus 3.3 x 1.7 cm intraventricular extension and mild left-to-right midline shift but no hydrocephalus. Followup cranial CT scan 09/23/2013 unchanged. Dr. 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 Echocardiogram with ejection fraction 55% no wall motion abnormalities. Carotid dopplers without ICA stenosis. HgbA1c 5.6. He was admitted to the inpatient rehabilitation on 09/25/2013 and was discharged on 10/07/2013 .Has been getting outpatient therapy. Ambulating outside without assisted device. He has some residual right-sided sensory deficits and mild weakness in the right leg. He has not resumed smoking. He denies falls. He complains of problems with recalling words and short term memory difficulties. He states his blood pressure is well controlled, but he is currently taking 5 different antihypertensives. He has not returned to driving and has not returned to work.  REVIEW OF SYSTEMS: Full 14 system review of systems performed and notable only for: memory loss and speech difficulty.   ALLERGIES: Allergies  Allergen Reactions  . Penicillins Other (See Comments)    unknown  . Sulfa Antibiotics Other (See Comments)    unknown    HOME MEDICATIONS: Outpatient Prescriptions Prior to Visit  Medication Sig Dispense Refill  . amLODipine (NORVASC) 10 MG tablet Take 1 tablet (10 mg total) by mouth daily with breakfast. For blood pressure  30 tablet  1  . hydrALAZINE (APRESOLINE) 50 MG tablet Take 1 tablet (50 mg total) by mouth 3 (three) times daily. For blood pressure   90 tablet  1  . hydrochlorothiazide (MICROZIDE) 12.5 MG capsule Take 1 capsule (12.5 mg total) by mouth daily. For blood pressure  30 capsule  1  . labetalol (NORMODYNE) 200 MG tablet Take 1 tablet (200 mg total) by mouth 2 (two) times daily. With breakfast and supper.  For blood pressure  60 tablet  1  . lisinopril (PRINIVIL,ZESTRIL) 20 MG tablet Take 1 tablet (20 mg total) by mouth 2 (two) times daily. With breakfast and supper--for blood pressure.  60 tablet  1  . nicotine (NICODERM CQ - DOSED IN MG/24 HOURS) 14 mg/24hr patch Place 1 patch (14 mg total) onto the skin daily.  28 patch  0  . pantoprazole (PROTONIX) 40 MG tablet Take 1 tablet (40 mg total) by mouth at bedtime. For indigestion.  30 tablet  1  . traMADol (ULTRAM) 50 MG tablet Take 1 tablet (50 mg total) by mouth 4 (four) times daily as needed for moderate pain.  75 tablet  0   No facility-administered medications prior to visit.    PHYSICAL EXAM Filed Vitals:   11/09/13 1504  BP: 131/76  Pulse: 65  Temp: 97 F (36.1 C)  TempSrc: Oral  Height: 5\' 10"  (1.778 m)  Weight: 188 lb (85.276 kg)   Body mass index is 26.98 kg/(m^2).  Visual Acuity Screening   Right eye Left eye Both eyes  Without correction: 20/40 20/50   With correction:      Montreal Cognitive Assessment  11/09/2013  Visuospatial/ Executive (0/5) 5  Naming (0/3) 3  Attention: Read list of digits (0/2) 2  Attention: Read list of letters (0/1) 0  Attention: Serial 7 subtraction  starting at 100 (0/3) 0  Language: Repeat phrase (0/2) 2  Language : Fluency (0/1) 1  Abstraction (0/2) 2  Delayed Recall (0/5) 0  Orientation (0/6) 5  Total 20  Adjusted Score (based on education) 21   Generalized: Well developed, in no acute distress  Head: normocephalic and atraumatic. Oropharynx benign  Neck: Supple, no carotid bruits  Cardiac: Regular rate rhythm, no murmur  Musculoskeletal: No deformity   Neurological examination  Mentation: Alert oriented to time,  place, history taking. Follows all commands speech and language fluent Cranial nerve II-XII: Fundoscopic exam not done. Pupils were equal round reactive to light extraocular movements were full, visual field were full on confrontational test. Facial sensation slightly decreased on the right. mild right nasolabial fold flattening.  hearing was intact to finger rubbing bilaterally. Uvula tongue midline. head turning and shoulder shrug and were normal and symmetric.Tongue protrusion into cheek strength was normal. Motor: The motor testing reveals 5 over 5 strength of all 4 extremities. Good symmetric motor tone is noted throughout.  Sensory: Sensory testing  is slightly decreased on the right to light touch and pinprick.  No evidence of extinction is noted.  Coordination: Cerebellar testing reveals good finger-nose-finger and heel-to-shin bilaterally.  Gait and station: Gait is normal. Tandem gait is normal. Romberg is negative. Reflexes: Deep tendon reflexes are symmetric and normal bilaterally.   DIAGNOSTIC DATA (LABS, IMAGING, TESTING) - I reviewed patient records, labs, notes, testing and imaging myself where available.  Lab Results  Component Value Date   WBC 5.5 09/30/2013   HGB 15.5 09/30/2013   HCT 45.8 09/30/2013   MCV 93.7 09/30/2013   PLT 127* 09/30/2013      Component Value Date/Time   NA 138 10/01/2013 0717   K 4.1 10/01/2013 0717   CL 99 10/01/2013 0717   CO2 26 10/01/2013 0717   GLUCOSE 97 10/01/2013 0717   BUN 27* 10/01/2013 0717   CREATININE 1.32 10/01/2013 0717   CALCIUM 9.8 10/01/2013 0717   PROT 6.0 09/28/2013 0530   ALBUMIN 3.4* 09/28/2013 0530   AST 20 09/28/2013 0530   ALT 57* 09/28/2013 0530   ALKPHOS 77 09/28/2013 0530   BILITOT 0.7 09/28/2013 0530   GFRNONAA 59* 10/01/2013 0717   GFRAA 69* 10/01/2013 0717   Lab Results  Component Value Date   CHOL 169 09/23/2013   HDL 44 09/23/2013   LDLCALC 161109* 09/23/2013   TRIG 78 09/23/2013   CHOLHDL 3.8 09/23/2013   Lab Results  Component Value Date     HGBA1C 5.6 09/23/2013   No results found for this basename: [id number]   Lab Results  Component Value Date   TSH 0.812 09/23/2013    ASSESSMENT: Jay Wise is a 53 y.o. male with hx of untreated HTN, smoking, alcohol use, brought in by EMS as a code stroke due to right hemiparesis and dysarthria on 09/22/13. He did not receive IV t-PA due to hemorrhage. 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 Residual left-sided weakness and sensory deficits with cognitive deficits related to short-term memory and attention. Montral cognitive assessment score is 21/30.  PLAN: I had a long discussion with the patient and family regarding his recent stroke, discussed results of evaluation in the hospital and answered questions.  Maintain strict control of hypertension with blood pressure goal below 130/90, and lipids with LDL cholesterol goal below 100 mg/dL. Stressed compliance with hypertensive medications. Unable to work secondary to stroke related deficits at  the present time. Continue outpatient therapies, and after they are finished continue the exercises they teach you. Your memory will improve with time.  You can use computer games such as elevate, an app on the phone or tablet to stimulate your brain function.  We performed a baseline memory test today.  We can recheck it at future visits to see if there is any change. Continue to take your blood pressure medicines.  This will be managed by your Primary care MD. Follow up with Dr. Wynn BankerKirsteins. Please do not drive until he sees you in followup. Follow up with Dr. Roda ShuttersXu in 3 months, sooner as needed.  Tawny AsalLYNN E. LAM, MSN, FNP-BC, A/GNP-C 11/09/2013, 7:56 PM [facility] [address], [address] [place], [place] [address] [phone]  Note: This document was prepared with digital dictation and possible smart phrase technology. Any transcriptional errors that result from this process are  unintentional.

## 2013-11-22 ENCOUNTER — Encounter: Payer: Self-pay | Admitting: Physical Medicine & Rehabilitation

## 2013-12-02 ENCOUNTER — Other Ambulatory Visit: Payer: Self-pay | Admitting: Physical Medicine and Rehabilitation

## 2013-12-14 ENCOUNTER — Encounter: Payer: PRIVATE HEALTH INSURANCE | Attending: Physical Medicine & Rehabilitation

## 2013-12-14 ENCOUNTER — Encounter: Payer: Self-pay | Admitting: Physical Medicine & Rehabilitation

## 2013-12-14 ENCOUNTER — Ambulatory Visit (HOSPITAL_BASED_OUTPATIENT_CLINIC_OR_DEPARTMENT_OTHER): Payer: PRIVATE HEALTH INSURANCE | Admitting: Physical Medicine & Rehabilitation

## 2013-12-14 VITALS — BP 166/78 | HR 60 | Resp 14 | Ht 72.0 in | Wt 172.0 lb

## 2013-12-14 DIAGNOSIS — I69398 Other sequelae of cerebral infarction: Secondary | ICD-10-CM | POA: Diagnosis not present

## 2013-12-14 DIAGNOSIS — I69359 Hemiplegia and hemiparesis following cerebral infarction affecting unspecified side: Secondary | ICD-10-CM

## 2013-12-14 DIAGNOSIS — I619 Nontraumatic intracerebral hemorrhage, unspecified: Secondary | ICD-10-CM | POA: Insufficient documentation

## 2013-12-14 DIAGNOSIS — I69120 Aphasia following nontraumatic intracerebral hemorrhage: Secondary | ICD-10-CM | POA: Diagnosis not present

## 2013-12-14 NOTE — Patient Instructions (Signed)

## 2013-12-14 NOTE — Progress Notes (Signed)
Subjective:    Patient ID: Jay Wise, male    DOB: 1960-05-24, 53 y.o.   MRN: [id number]  HPI 53 y.o. right handed male with history of untreated hypertension, tobacco abuse; who was admitted 09/22/2013 with right-sided weakness and aphasia. Systolic blood pressure 210 at admisison and CT of the brain in the left thalamus 3.3 x 1.7 cm intraventricular extension and mild left-to-right midline shift but no hydrocephalus. Followup cranial CT scan 09/23/2013 unchanged. Dr. 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 Echocardiogram with ejection fraction 55% no wall motion abnormalities. Carotid dopplers without ICA stenosis. Renal artery duplex without RAS but L-RA not insonated.    Admit date: 09/25/2013 Discharge date: 10/07/2013  Occ word finding deficits  Doing yard work , blowing leaves [place] 3 hours [place] Still has OT and Speech Pain Inventory Average Pain 0 Pain Right Now 0 My pain is no pain  In the last 24 hours, has pain interfered with the following? General activity 0 Relation with others 0 Enjoyment of life 0 What TIME of day is your pain at its worst? no pain Sleep (in general) Good  Pain is worse with: n/a Pain improves with: . Relief from Meds: 0  Mobility walk without assistance how many minutes can you walk? 60+ ability to climb steps?  yes do you drive?  yes  Function disabled: date disabled .  Neuro/Psych No problems in this area  Prior Studies Any changes since last visit?  no  Physicians involved in your care Any changes since last visit?  no   Family History  Problem Relation Age of Onset  . Cancer Father    History   Social History  . Marital Status: Married    Spouse Name: Irving Burtonmily    Number of Children: 2  . Years of Education: 12   Occupational History  . PARTS SALES    Social History Main Topics  . Smoking status: Former Smoker    Types:  Cigarettes    Quit date: 09/18/2013  . Smokeless tobacco: Never Used  . Alcohol Use: Yes  . Drug Use: No  . Sexual Activity: None   Other Topics Concern  . None   Social History Narrative   Patient is right handed and consumes 4-5 20oz daily   Past Surgical History  Procedure Laterality Date  . Ankle surgery Right 2000    2 skrews  . Rotator cuff repair Right 2009   Past Medical History  Diagnosis Date  . Hypertension   . Asthma   . Stroke    BP 166/78 mmHg  Pulse 60  Resp 14  Ht 6' (1.829 m)  Wt 172 lb (78.019 kg)  BMI 23.32 kg/m2  SpO2 96%  Opioid Risk Score:   Fall Risk Score: Low Fall Risk (0-5 points)  Review of Systems  Constitutional: Negative.   HENT: Negative.   Eyes: Negative.   Respiratory: Negative.   Cardiovascular: Negative.   Gastrointestinal: Negative.   Endocrine: Negative.   Genitourinary: Negative.   Musculoskeletal: Negative.   Skin: Negative.   Allergic/Immunologic: Negative.   Neurological: Negative.   Hematological: Negative.   Psychiatric/Behavioral: Negative.        Objective:   Physical Exam  Constitutional: He is oriented to person, place, and time. He appears well-developed and well-nourished.  HENT:  Head: Normocephalic and atraumatic.  Eyes: Conjunctivae and EOM are normal. Pupils are equal, round, and reactive to  light.  Neurological: He is alert and oriented to person, place, and time.  Psychiatric: He has a normal mood and affect.  Nursing note and vitals reviewed.  Strength 5 minus/5 in the right deltoid, biceps, triceps, grip, hip flexion, knee extensors, ankle dorsi flexors and plantar flexion  5/5 in the left deltoid, bicep, crit, triceps, grip, hip flexors, knee extensors, ankle dorsi flexion plantar flexor  Sensory intact to light touch and pinprick in the right upper extremity  Speech with word finding deficits. Mildly dysfluent, able to follow multi step commands however takes longer to initiate         Assessment & Plan:  1.  Left thalamic CVA with R HP , aphasia, making excellent progress ok to resume driving with restrictions as below  Graduated return to driving instructions were provided. It is recommended that the patient first drives with another licensed driver in an empty parking lot. If the patient does well with this, and they can drive on a quiet street with the licensed driver. If the patient does well with this they can drive on a busy street with a licensed driver. If the patient does well with this, the next time out they can go by himself. For the first month after resuming driving, I recommend no nighttime or Interstate driving.   We discussed that his deficits at this point are mainly in the realm of speech and language. From an employment standpoint I think this is his major barrier although he still has some evidence of limp as well as mild fine motor deficits in the right upper extremity  I think he still disabled from work at this point but we'll reevaluate in 1 month, continue speech therapy plus minus OT.

## 2013-12-15 ENCOUNTER — Ambulatory Visit: Payer: PRIVATE HEALTH INSURANCE | Admitting: Physical Medicine & Rehabilitation

## 2013-12-22 ENCOUNTER — Encounter: Payer: Self-pay | Admitting: Physical Medicine & Rehabilitation

## 2014-01-01 ENCOUNTER — Other Ambulatory Visit: Payer: Self-pay | Admitting: Physical Medicine & Rehabilitation

## 2014-01-06 ENCOUNTER — Telehealth: Payer: Self-pay | Admitting: *Deleted

## 2014-01-06 NOTE — Telephone Encounter (Signed)
Message relayed to wife from Dr Wynn BankerKirsteins.  He was seen by someone but it was not Bronstein.  I reinforced again that he needs to be seen by a pcp for bp meds and to follow labwork, we will not be doing this from this office.

## 2014-01-06 NOTE — Telephone Encounter (Signed)
Dr Dorothey Basemanavid Bronstein needs to refill, [place] supposed to have an appt 9/18 Pt was D/Ced 3 mo ago no longer managing HTN

## 2014-01-06 NOTE — Telephone Encounter (Signed)
Wife calling because they need all five of the medications from hospital discharge refilled ( amLODipine (NORVASC) 10 MG tablet, hydrochlorothiazide (MICROZIDE) 12.5 MG capsule,labetalol (NORMODYNE) 200 MG tablet, lisinopril (PRINIVIL,ZESTRIL) 20 MG tablet, pantoprazole (PROTONIX) 40 MG tablet) We tried to explain our policy of not refilling these meds past discharge and that they must be assumed by PCP but she says that he is out and the pharmacy gave them 3 day supply until we could refill them and that they do not see PCP until March.  Do we refill them?

## 2014-01-11 ENCOUNTER — Encounter: Payer: Self-pay | Admitting: Physical Medicine & Rehabilitation

## 2014-01-11 ENCOUNTER — Encounter: Payer: PRIVATE HEALTH INSURANCE | Attending: Physical Medicine & Rehabilitation

## 2014-01-11 ENCOUNTER — Ambulatory Visit (HOSPITAL_BASED_OUTPATIENT_CLINIC_OR_DEPARTMENT_OTHER): Payer: PRIVATE HEALTH INSURANCE | Admitting: Physical Medicine & Rehabilitation

## 2014-01-11 VITALS — BP 165/89 | HR 61 | Resp 14

## 2014-01-11 DIAGNOSIS — I619 Nontraumatic intracerebral hemorrhage, unspecified: Secondary | ICD-10-CM | POA: Insufficient documentation

## 2014-01-11 DIAGNOSIS — I69398 Other sequelae of cerebral infarction: Secondary | ICD-10-CM | POA: Insufficient documentation

## 2014-01-11 DIAGNOSIS — I69359 Hemiplegia and hemiparesis following cerebral infarction affecting unspecified side: Secondary | ICD-10-CM | POA: Diagnosis present

## 2014-01-11 DIAGNOSIS — I69120 Aphasia following nontraumatic intracerebral hemorrhage: Secondary | ICD-10-CM | POA: Diagnosis not present

## 2014-01-11 DIAGNOSIS — I69320 Aphasia following cerebral infarction: Secondary | ICD-10-CM

## 2014-01-11 NOTE — Progress Notes (Signed)
Subjective:    Patient ID: Jay Wise, male    DOB: Jun 20, 1960, 53 y.o.   MRN: [id number] 53 y.o. right handed male with history of untreated hypertension, tobacco abuse; who was admitted 09/22/2013 with right-sided weakness and aphasia. Systolic blood pressure 210 at admisison and CT of the brain in the left thalamus 3.3 x 1.7 cm intraventricular extension and mild left-to-right midline shift but no hydrocephalus. Followup cranial CT scan 09/23/2013 unchanged. Dr. 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 Echocardiogram with ejection fraction 55% no wall motion abnormalities. Carotid dopplers without ICA stenosis. Renal artery duplex without RAS but L-RA not insonated.  Admit date: 09/25/2013  Discharge date: 10/07/2013  HPI Just doing speech therapy at [facility], 3 days a week Able to use keyboard  Using computer Doing word association tasks At work he does little communication on phone, mainly face to face Got Christmas stuff out of attic Pain Inventory Average Pain no pain Pain Right Now no pain My pain is no pain  In the last 24 hours, has pain interfered with the following? General activity no pain Relation with others no pain Enjoyment of life no pain What TIME of day is your pain at its worst? no pain Sleep (in general) Good  Pain is worse with: no pain Pain improves with: no pain Relief from Meds: no pain  Mobility walk without assistance how many minutes can you walk? unlimited ability to climb steps?  yes do you drive?  yes Do you have any goals in this area?  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  yes  Neuro/Psych No problems in this area  Prior Studies Any changes since last visit?  no  Physicians involved in your care Any changes since last visit?  no   Family History  Problem Relation Age of Onset  . Cancer Father    History    Social History  . Marital Status: Married    Spouse Name: Irving Burtonmily    Number of Children: 2  . Years of Education: 12   Occupational History  . PARTS SALES    Social History Main Topics  . Smoking status: Former Smoker    Types: Cigarettes    Quit date: 09/18/2013  . Smokeless tobacco: Never Used  . Alcohol Use: Yes  . Drug Use: No  . Sexual Activity: None   Other Topics Concern  . None   Social History Narrative   Patient is right handed and consumes 4-5 20oz daily   Past Surgical History  Procedure Laterality Date  . Ankle surgery Right 2000    2 skrews  . Rotator cuff repair Right 2009   Past Medical History  Diagnosis Date  . Hypertension   . Asthma   . Stroke    BP 165/89 mmHg  Pulse 61  Resp 14  SpO2 100%  Opioid Risk Score:   Fall Risk Score: Low Fall Risk (0-5 points)  Review of Systems  All other systems reviewed and are negative.      Objective:   Physical Exam  Constitutional: He is oriented to person, place, and time. He appears well-developed.  Neurological: He is alert and oriented to person, place, and time. He has normal reflexes.  Psychiatric: He has a normal mood and affect.  Nursing note and vitals reviewed.  Motor strength is 5/5 bilateral deltoids, biceps, triceps, grip, hip  flexor, knee extensor, ankle dorsi flexors and plantar flexor Sensation intact to light touch bilateral upper limbs. Speech he is able to name simple objects such as watch ring etc. Mild difficulty repeating [facility] Orientation to person and place as well as month and year but not to date        Assessment & Plan:  1. Left thalamic CVA with R HP , aphasia, making excellent progress  We discussed that his deficits at this point are mainly in the realm of speech and language.  , continue speech therapy  He should be ready to resume work activities January 4 on a part-time basis and then after 2 weeks resume full-time basis. I'll see him back  during that transition Which will be in about 4 weeks  PCP follow-up for antihypertensive medications Neuro follow-up for secondary stroke prevention. Patient denies any smoking or drinking alcohol since his stroke  May resume nighttime driving as well as Interstate driving however I recommend another licensed driver with him the first time he does these activities.

## 2014-01-11 NOTE — Patient Instructions (Signed)
May resume driving at night and on interest states. First time please have another licensed driver in the car with you to see how you do  May resume part-time work on January 4 and may resume full-time work on January 18

## 2014-01-22 ENCOUNTER — Encounter: Payer: Self-pay | Admitting: Physical Medicine & Rehabilitation

## 2014-02-04 ENCOUNTER — Telehealth: Payer: Self-pay | Admitting: *Deleted

## 2014-02-04 NOTE — Telephone Encounter (Signed)
Wife, Irving Burtonmily called regarding the need for additional paperwork to help get her husband back to gainful employment. She left a message with the clinic line and Jason's extension. I conversed with Barbara CowerJason, the paperwork is here on Johnette's desk awaiting to be filled out and signed. Spoke with Pt's wife and relayed what i learned, pt was satisfied that we are dealing with it

## 2014-02-15 ENCOUNTER — Encounter: Payer: Self-pay | Admitting: Neurology

## 2014-02-15 ENCOUNTER — Encounter: Payer: Self-pay | Admitting: Physical Medicine & Rehabilitation

## 2014-02-15 ENCOUNTER — Ambulatory Visit (INDEPENDENT_AMBULATORY_CARE_PROVIDER_SITE_OTHER): Payer: BC Managed Care – PPO | Admitting: Neurology

## 2014-02-15 ENCOUNTER — Ambulatory Visit (HOSPITAL_BASED_OUTPATIENT_CLINIC_OR_DEPARTMENT_OTHER): Payer: BC Managed Care – PPO | Admitting: Physical Medicine & Rehabilitation

## 2014-02-15 ENCOUNTER — Encounter: Payer: PRIVATE HEALTH INSURANCE | Attending: Physical Medicine & Rehabilitation

## 2014-02-15 VITALS — BP 159/78 | HR 64 | Ht 72.0 in | Wt 180.0 lb

## 2014-02-15 VITALS — BP 149/74 | HR 65 | Resp 14

## 2014-02-15 DIAGNOSIS — I69359 Hemiplegia and hemiparesis following cerebral infarction affecting unspecified side: Secondary | ICD-10-CM | POA: Insufficient documentation

## 2014-02-15 DIAGNOSIS — I69398 Other sequelae of cerebral infarction: Secondary | ICD-10-CM

## 2014-02-15 DIAGNOSIS — E785 Hyperlipidemia, unspecified: Secondary | ICD-10-CM

## 2014-02-15 DIAGNOSIS — I10 Essential (primary) hypertension: Secondary | ICD-10-CM | POA: Diagnosis not present

## 2014-02-15 DIAGNOSIS — I619 Nontraumatic intracerebral hemorrhage, unspecified: Secondary | ICD-10-CM

## 2014-02-15 DIAGNOSIS — I69320 Aphasia following cerebral infarction: Secondary | ICD-10-CM

## 2014-02-15 DIAGNOSIS — I69120 Aphasia following nontraumatic intracerebral hemorrhage: Secondary | ICD-10-CM | POA: Diagnosis not present

## 2014-02-15 MED ORDER — ASPIRIN EC 81 MG PO TBEC: 81.0000 mg | DELAYED_RELEASE_TABLET | Freq: Every day | ORAL | Status: AC

## 2014-02-15 MED ORDER — ATORVASTATIN CALCIUM 10 MG PO TABS: 10.0000 mg | ORAL_TABLET | Freq: Every day | ORAL | Status: DC

## 2014-02-15 NOTE — Patient Instructions (Addendum)
May work 6 hours per day 5 days per week until the next clinic visit which will be in approximately 4 weeks  No lifting restrictions

## 2014-02-15 NOTE — Progress Notes (Signed)
STROKE NEUROLOGY FOLLOW UP NOTE  NAME: Jay Wise DOB: Jun 02, 1960  REASON FOR VISIT: stroke follow up HISTORY FROM: chart and wife and pt  Today we had the pleasure of seeing [name] in follow-up at our Neurology Clinic. Pt was accompanied by wife.   History Summary 54 y.o. right handed male with history of untreated hypertension, tobacco abuse was admitted on 09/22/2013 with right-sided weakness and aphasia. Systolic blood pressure BP on arrival 242/148 at [facility]. No home meds. CT of the brain in the left thalamus 3.3 x 1.7 cm intraventricular extension and mild left-to-right midline shift but no hydrocephalus. Followup cranial CT scan 09/23/2013 unchanged. 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 He was put on 5 po meds for BP control. He was then admitted to the inpatient rehabilitation on 09/25/2013 and was discharged on 10/07/2013.  Follow up on 11/09/13 (LL) - has been getting outpatient therapy. Ambulating outside without assisted device. He has some residual right-sided sensory deficits and mild weakness in the right leg. He has not resumed smoking. He denies falls. He complains of problems with recalling words and short term memory difficulties. He states his blood pressure is well controlled, but he is currently taking 5 different antihypertensives. He has not returned to driving and has not returned to work.  Interval History During the interval time, the patient has been doing well. He stated that he has now back to his baseline. He has returned to part time work and feels good. He did not check BP at home and today BP is 160/78. He has PCP follow up in 03/2014. He continued not smoking. Denies symptoms of OSA from wife and pt.    REVIEW OF SYSTEMS: Full 14 system review of systems performed and notable only for those listed below and in HPI above, all others are negative:  Constitutional: N/A  Cardiovascular: N/A  Ear/Nose/Throat:  N/A  Skin: N/A  Eyes: N/A  Respiratory: N/A  Gastroitestinal: N/A  Genitourinary: N/A Hematology/Lymphatic: N/A  Endocrine: N/A  Musculoskeletal: N/A  Allergy/Immunology: N/A  Neurological: N/A  Psychiatric: N/A  The following represents the patient's updated allergies and side effects list: Allergies  Allergen Reactions  . Penicillins Other (See Comments)    unknown  . Sulfa Antibiotics Other (See Comments)    unknown    The neurologically relevant items on the patient's problem list were reviewed on today's visit.  Neurologic Examination  A problem focused neurological exam (12 or more points of the single system neurologic examination, vital signs counts as 1 point, cranial nerves count for 8 points) was performed.  Blood pressure 159/78, pulse 64, height 6' (1.829 m), weight 180 lb (81.647 kg).  General - Well nourished, well developed, in no apparent distress.  Ophthalmologic - Sharp disc margins OU.  Cardiovascular - Regular rate and rhythm with no murmur.  Mental Status -  Level of arousal and orientation to time, place, and person were intact. Language including expression, naming, repetition, comprehension was assessed and found intact. MMSE 29/30  Cranial Nerves II - XII - II - Visual field intact OU. III, IV, VI - Extraocular movements intact. V - Facial sensation intact bilaterally. VII - Facial movement intact bilaterally. VIII - Hearing & vestibular intact bilaterally. X - Palate elevates symmetrically. XI - Chin turning & shoulder shrug intact bilaterally. XII - Tongue protrusion intact.  Motor Strength - The patient's strength was normal in all extremities and pronator drift was absent.  Bulk  was normal and fasciculations were absent.   Motor Tone - Muscle tone was assessed at the neck and appendages and was normal.  Reflexes - The patient's reflexes were normal in all extremities and he had no pathological reflexes.  Sensory - Light touch,  temperature/pinprick, vibration and proprioception, and Romberg testing were assessed and were normal.    Coordination - The patient had normal movements in the hands and feet with no ataxia or dysmetria.  Tremor was absent.  Gait and Station - The patient's transfers, posture, gait, station, and turns were observed as normal.  Data reviewed: I personally reviewed the images and agree with the radiology interpretations.  Ct Head (brain) Wo Contrast  09/23/2013 Left thalamic hematoma unchanged. Intraventricular hemorrhage again  noted without hydrocephalus. Mild midline shift to the right  measuring 5 mm unchanged.  09/22/2013 Acute intraparenchymal hemorrhage at LEFT thalamus 3.3 x 1.7 cm with evidence of intraventricular extension and mild LEFT-to-RIGHT midline shift. Small vessel chronic ischemic changes of deep cerebral white matter. 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  2D echo  - Left ventricle: The cavity size was normal. Wall thickness was increased in a pattern of severe LVH. Systolic function was normal. The estimated ejection fraction was in the range of 50% to 55%. Wall motion was normal; there were no regional wall motion abnormalities. - Left atrium: The atrium was moderately dilated. - Right atrium: The atrium was moderately dilated.  CUS 1-39% ICA stenosis. Vertebral artery flow is antegrade  Renal Artery Duplex -  - No evidence of right renal artery stenosis. - Left renal artery not insonated.  Component     Latest Ref Rng 09/23/2013 09/25/2013  Cholesterol     0 - 200 mg/dL 409169   Triglycerides     <150 mg/dL 78   HDL     >81>39 mg/dL 44   Total CHOL/HDL Ratio      3.8   VLDL     0 - 40 mg/dL 16   LDL (calc)     0 - 99 mg/dL 191109 (H)   Norepinephrine       858  Epinephrine       172  Dopamine       REPORT  Total Catecholamines (Nor+Epi)       1030  PRA LC/MS/MS     0.25 - 5.82 ng/mL/h  0.27  ALDO / PRA Ratio     0.9 - 28.9  Ratio  14.8  ALDOSTERONE       4  Normetanephrine, Free     <=148 pg/mL  214 (H)  Metanephrine, Free     <=57 pg/mL  80 (H)  Total Metanephrines-Plasma     <=205 pg/mL  294 (H)  Hgb A1c MFr Bld     <5.7 % 5.6   Mean Plasma Glucose     <117 mg/dL 478114   Free T4     2.950.80 - 1.80 ng/dL 6.211.07   TSH     3.0860.350 - 4.500 uIU/mL 0.812     Assessment: As you may recall, he is a 54 y.o. 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 His BP was found to be very high. Was put on 5 different BP meds. Renal artery ultrasound and renin/aldosterone normal. Metanephrine just mildly high, likely due to ICH at that time, will repeat today. His BP still not in good  control today. For stroke prevention, will put on ASA 81 and low dose lipitor as his ICH is resolved.   Plan:  - continue blood pressure medications for BP control - check BP at home twice a day and record and bring over to PCP - Follow up with your primary care physician for stroke risk factor modification. Recommend maintain blood pressure goal <130/80, diabetes with hemoglobin A1c goal below 6.5% and lipids with LDL cholesterol goal below 70 mg/dL.  - will recheck metanepherin levels - will start ASA 81 and lipitor  for stroke prevention and HLD - RTC in 3 months  Orders Placed This Encounter  Procedures  . Metanephrines, plasma  . Catecholamines, fractionated, plasma    Meds ordered this encounter  Medications  . aspirin EC 81 MG tablet    Sig: Take 1 tablet (81 mg total) by mouth daily.    Dispense:  90 tablet    Refill:  3  . atorvastatin (LIPITOR) 10 MG tablet    Sig: Take 1 tablet (10 mg total) by mouth daily.    Dispense:  90 tablet    Refill:  3    Patient Instructions  - continue blood pressure medications for BP control - check BP at home twice a day and record and bring over to PCP - Follow up  with your primary care physician for stroke risk factor modification. Recommend maintain blood pressure goal <130/80, diabetes with hemoglobin A1c goal below 6.5% and lipids with LDL cholesterol goal below 70 mg/dL.  - will recheck blood metanepherin levels as last time was mildly abnormal - will start you on baby ASA and low dose lipitor for stroke prevention - follow up in 3 months   Marvel Plan, MD PhD [facility] [address], [address] [place], [place] [address] [phone]

## 2014-02-15 NOTE — Progress Notes (Signed)
Subjective:    Patient ID: Jay Wise, male    DOB: [birth year], 54 y.o.   MRN: [id number] 54 y.o. right handed male with history of untreated hypertension, tobacco abuse; who was admitted 09/22/2013 with right-sided weakness and aphasia. Systolic blood pressure 210 at admisison and CT of the brain in the left thalamus 3.3 x 1.7 cm intraventricular extension and mild left-to-right midline shift but no hydrocephalus. Followup cranial CT scan 09/23/2013 unchanged. Dr. 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 Echocardiogram with ejection fraction 55% no wall motion abnormalities. Carotid dopplers without ICA stenosis. Renal artery duplex without RAS but L-RA not insonated.   Admit date: 09/25/2013   Discharge date: 10/07/2013  HPI Speech therapy finished in Dec Driving without difficulty Back at work 4 hours per day, 5 days per week. Patient states that he has been able to perform his work activities. He is very tired when he got back from work and usually goes to bed at 7:30 or 8 PM at night. 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 Patient feels he may have made the same mistake even before his stroke.  His employer is requesting additional documentation from me prior to allowing patient to advance his work hours    Pain Inventory Average Pain 0 Pain Right Now 0 My pain is no pain  In the last 24 hours, has pain interfered with the following? General activity 0 Relation with others 0 Enjoyment of life 0 What TIME of day is your pain at its worst? no pain Sleep (in general) Good  Pain is worse with: no pain Pain improves with: no pain Relief from Meds: no pain  Mobility walk without assistance ability to climb steps?  yes do you drive?  yes  Function employed # of hrs/week 20/wk  Neuro/Psych No problems in this area  Prior Studies Any changes since last visit?   no  Physicians involved in your care Any changes since last visit?  no   Family History  Problem Relation Age of Onset  . Cancer Father    History   Social History  . Marital Status: Married    Spouse Name: Irving Burton    Number of Children: 2  . Years of Education: 12   Occupational History  . PARTS SALES    Social History Main Topics  . Smoking status: Former Smoker    Types: Cigarettes    Quit date: 09/18/2013  . Smokeless tobacco: Never Used  . Alcohol Use: Yes  . Drug Use: No  . Sexual Activity: None   Other Topics Concern  . None   Social History Narrative   Patient is right handed and consumes 4-5 20oz daily   Past Surgical History  Procedure Laterality Date  . Ankle surgery Right 2000    2 skrews  . Rotator cuff repair Right 2009   Past Medical History  Diagnosis Date  . Hypertension   . Asthma   . Stroke    BP 149/74 mmHg  Pulse 65  Resp 14  SpO2 97%  Opioid Risk Score:   Fall Risk Score: Low Fall Risk (0-5 points) Review of Systems  All other systems reviewed and are negative.      Objective:   Physical Exam  Able to name objects such as stethoscope rain, watch, watchband Motor strength is 5 minus in the right deltoid, biceps, triceps, grip 5/5 in the left deltoid, biceps,  triceps, grip, hip flexors, knee extensors, ankle dorsiflexor and plantar flexor Sensation intact to light touch in both upper limbs Occasional word finding deficit      Assessment & Plan:  1. Left thalamic CVA with R HP , aphasia, making excellent progress   We discussed that his deficits at this point are mainly in the realm of speech and language.   , continue speech therapy   Patient may advance work hours to 6 hours per day 5 days per week. His main issue is fatigue, and I expect this to improve over the next several weeks or months.  If patient's endurance improves over the next month I would anticipate advancing his work hours to 8 hours per day which is his  usual schedule.  PCP follow-up for antihypertensive medications Neuro follow-up for secondary stroke prevention. Patient denies any smoking or drinking alcohol since his stroke  Long discussion with the patient as well as his wife who is in the room with him.

## 2014-02-15 NOTE — Patient Instructions (Signed)
-   continue blood pressure medications for BP control - check BP at home twice a day and record and bring over to PCP - Follow up with your primary care physician for stroke risk factor modification. Recommend maintain blood pressure goal <130/80, diabetes with hemoglobin A1c goal below 6.5% and lipids with LDL cholesterol goal below 70 mg/dL.  - will recheck blood metanepherin levels as last time was mildly abnormal - will start you on baby ASA and low dose lipitor for stroke prevention - follow up in 3 months

## 2014-02-16 ENCOUNTER — Encounter: Payer: Self-pay | Admitting: Physical Medicine & Rehabilitation

## 2014-02-16 NOTE — Progress Notes (Signed)
I reviewed above note and agree with the assessment and plan.  Marvel PlanJindong Jeriah Skufca, MD PhD Stroke Neurology 02/16/2014 6:39 PM

## 2014-02-20 LAB — CATECHOLAMINES, FRACTIONATED, PLASMA
Dopamine: 36 pg/mL (ref 0–48)
Epinephrine: 85 pg/mL — ABNORMAL HIGH (ref 0–62)
Norepinephrine: 539 pg/mL (ref 0–874)

## 2014-02-20 LAB — METANEPHRINES, PLASMA
Metanephrine, Free: 30 pg/mL (ref 0–62)
NORMETANEPHRINE FREE: 173 pg/mL — AB (ref 0–145)

## 2014-02-22 ENCOUNTER — Telehealth: Payer: Self-pay

## 2014-02-22 NOTE — Telephone Encounter (Signed)
Called and left message to call back about results

## 2014-02-22 NOTE — Telephone Encounter (Signed)
Patient returning call- please advise.  

## 2014-02-22 NOTE — Telephone Encounter (Signed)
Called patient back relayed labs. Keep medication mgt. Patient understood.

## 2014-03-01 ENCOUNTER — Telehealth: Payer: Self-pay | Admitting: *Deleted

## 2014-03-01 NOTE — Telephone Encounter (Signed)
Spoke with patient's wife Irving Burtonmily and informed her that patient's blood work came back good, no change in medication or treatment plan.

## 2014-03-01 NOTE — Telephone Encounter (Signed)
-----   Message from Darreld Mcleanana Cox sent at 02/22/2014 11:22 AM EST -----   ----- Message -----    From: Marvel PlanJindong Xu, MD    Sent: 02/20/2014   5:15 PM      To: [facility] Triage Pool  Hello, could you please let the patient note that her blood test the other day in clinic was much better than 4 months ago. No medication changes needed, no further testing needed at this time. Continue current management. Thank you.  Marvel PlanJindong Xu, MD PhD Stroke Neurology 02/20/2014 5:15 PM

## 2014-03-15 ENCOUNTER — Encounter: Payer: BC Managed Care – PPO | Attending: Physical Medicine & Rehabilitation

## 2014-03-15 ENCOUNTER — Ambulatory Visit (HOSPITAL_BASED_OUTPATIENT_CLINIC_OR_DEPARTMENT_OTHER): Payer: BC Managed Care – PPO | Admitting: Physical Medicine & Rehabilitation

## 2014-03-15 ENCOUNTER — Encounter: Payer: Self-pay | Admitting: Physical Medicine & Rehabilitation

## 2014-03-15 VITALS — BP 226/106 | HR 66 | Resp 14

## 2014-03-15 DIAGNOSIS — I69359 Hemiplegia and hemiparesis following cerebral infarction affecting unspecified side: Secondary | ICD-10-CM | POA: Diagnosis present

## 2014-03-15 DIAGNOSIS — I619 Nontraumatic intracerebral hemorrhage, unspecified: Secondary | ICD-10-CM

## 2014-03-15 DIAGNOSIS — I69398 Other sequelae of cerebral infarction: Secondary | ICD-10-CM | POA: Insufficient documentation

## 2014-03-15 DIAGNOSIS — I69320 Aphasia following cerebral infarction: Secondary | ICD-10-CM

## 2014-03-15 DIAGNOSIS — I69120 Aphasia following nontraumatic intracerebral hemorrhage: Secondary | ICD-10-CM | POA: Insufficient documentation

## 2014-03-15 NOTE — Progress Notes (Signed)
Subjective:    Patient ID: Jay Wise, male    DOB: 08-Jun-1960, 54 y.o.   MRN: [id number]  HPI Works 6 hours per day without fatigue but goes home and in bed around 8pm  Uses reading glasses but no other vision concerns  Driving without issues  No new medical issues BP in am 145-150 sys In pm ~135 sys  Sees PCP in March Dr Larwance SachsBabaoff Pain Inventory Average Pain 0 Pain Right Now 0 My pain is no pain  In the last 24 hours, has pain interfered with the following? General activity 0 Relation with others 0 Enjoyment of life 0 What TIME of day is your pain at its worst? no pain Sleep (in general) Good  Pain is worse with: no pain Pain improves with: no pain Relief from Meds: 0  Mobility walk without assistance how many minutes can you walk? 30 ability to climb steps?  yes do you drive?  no  Function disabled: date disabled .  Neuro/Psych No problems in this area  Prior Studies Any changes since last visit?  no  Physicians involved in your care Any changes since last visit?  no   Family History  Problem Relation Age of Onset  . Cancer Father    History   Social History  . Marital Status: Married    Spouse Name: Jay Wise  . Number of Children: 2  . Years of Education: 12   Occupational History  . PARTS SALES    Social History Main Topics  . Smoking status: Former Smoker    Types: Cigarettes    Quit date: 09/18/2013  . Smokeless tobacco: Never Used  . Alcohol Use: No  . Drug Use: No  . Sexual Activity: Not on file   Other Topics Concern  . None   Social History Narrative   Patient is right handed and consumes 4-5 20oz daily   Past Surgical History  Procedure Laterality Date  . Ankle surgery Right 2000    2 skrews  . Rotator cuff repair Right 2009   Past Medical History  Diagnosis Date  . Hypertension   . Asthma   . Stroke    BP 226/106 mmHg  Pulse 66  Resp 14  SpO2 97%  Opioid Risk Score:   Fall Risk Score: Low Fall Risk (0-5  points) Review of Systems  Constitutional: Negative.   HENT: Negative.   Eyes: Negative.   Respiratory: Negative.   Cardiovascular: Negative.   Gastrointestinal: Negative.   Endocrine: Negative.   Genitourinary: Negative.   Musculoskeletal: Negative.   Skin: Negative.   Allergic/Immunologic: Negative.   Neurological: Negative.   Hematological: Negative.   Psychiatric/Behavioral: Negative.        Objective:   Physical Exam  Constitutional: He is oriented to person, place, and time. He appears well-developed and well-nourished.  HENT:  Head: Normocephalic and atraumatic.  Eyes: EOM are normal. Pupils are equal, round, and reactive to light.  Neurological: He is alert and oriented to person, place, and time. Gait abnormal.  occ anomia    Psychiatric: He has a normal mood and affect.  Nursing note and vitals reviewed.   Extremities without sensory deficits to light touch. Strength is 5/5 bilateral deltoid, biceps, triceps, grip In the legs with stifflegged gait with right  no toe drag or knee instability      Assessment & Plan:  1.  Left thalamic hemorrhage-Excellent recovery. Back at work 6 hours per day driving. 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  Return to clinic in 2 months follow-up and if doing well at that point we'll not need any further physical medicine rebuilt patient follow-up  Elevated blood pressure today instructed to recheck at home which has been doing well for him. If it still elevated call PCPPatient states he is taking his medicines on a regular basis in the blood pressures have been running an acceptable range as noted above.

## 2014-03-16 ENCOUNTER — Ambulatory Visit: Payer: PRIVATE HEALTH INSURANCE | Admitting: Physical Medicine & Rehabilitation

## 2014-05-10 ENCOUNTER — Ambulatory Visit: Payer: BC Managed Care – PPO | Admitting: Physical Medicine & Rehabilitation

## 2014-05-24 ENCOUNTER — Encounter: Payer: Self-pay | Admitting: Neurology

## 2014-05-24 ENCOUNTER — Ambulatory Visit (INDEPENDENT_AMBULATORY_CARE_PROVIDER_SITE_OTHER): Payer: BC Managed Care – PPO | Admitting: Neurology

## 2014-05-24 ENCOUNTER — Ambulatory Visit: Payer: PRIVATE HEALTH INSURANCE | Admitting: Neurology

## 2014-05-24 VITALS — BP 158/81 | HR 76 | Wt 191.4 lb

## 2014-05-24 DIAGNOSIS — I10 Essential (primary) hypertension: Secondary | ICD-10-CM

## 2014-05-24 DIAGNOSIS — E785 Hyperlipidemia, unspecified: Secondary | ICD-10-CM

## 2014-05-24 DIAGNOSIS — I619 Nontraumatic intracerebral hemorrhage, unspecified: Secondary | ICD-10-CM

## 2014-05-24 NOTE — Progress Notes (Signed)
STROKE NEUROLOGY FOLLOW UP NOTE  NAME: Jay Wise DOB: [birth year]  REASON FOR VISIT: stroke follow up HISTORY FROM: chart and wife and pt  Today we had the pleasure of seeing Jay Wise in follow-up at our Neurology Clinic. Pt was accompanied by wife.   History Summary 54 y.o. right handed male with history of untreated hypertension, tobacco abuse was admitted on 09/22/2013 with right-sided weakness and aphasia. Systolic blood pressure BP on arrival 242/148 at [facility]. No home meds. CT of the brain in the left thalamus 3.3 x 1.7 cm intraventricular extension and mild left-to-right midline shift but no hydrocephalus. Followup cranial CT scan 09/23/2013 unchanged. 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 He was put on 5 po meds for BP control. He was then admitted to the inpatient rehabilitation on 09/25/2013 and was discharged on 10/07/2013.  Follow up on 11/09/13 (LL) - has been getting outpatient therapy. Ambulating outside without assisted device. He has some residual right-sided sensory deficits and mild weakness in the right leg. He has not resumed smoking. He denies falls. He complains of problems with recalling words and short term memory difficulties. He states his blood pressure is well controlled, but he is currently taking 5 different antihypertensives. He has not returned to driving and has not returned to work.  Follow up 02/15/14 - the patient has been doing well. He stated that he has now back to his baseline. He has returned to part time work and feels good. He did not check BP at home and today BP is 160/78. He has PCP follow up in 03/2014. He continued not smoking. Denies symptoms of OSA from wife and pt.  Interval History During the interval time, pt has been doing well. On 5 BP meds for BP control. Pt and wife said when at home, BP check was 120-130s, but today in clinic 158/51. He said that he has "white coat" syndrome. He was  recently laid off due to not perform as good as before. He is looking for new job now. He is on ASA and lipitor without side effects.  REVIEW OF SYSTEMS: Full 14 system review of systems performed and notable only for those listed below and in HPI above, all others are negative:  Constitutional: N/A  Cardiovascular: N/A  Ear/Nose/Throat: N/A  Skin: N/A  Eyes: N/A  Respiratory: N/A  Gastroitestinal: N/A  Genitourinary: N/A Hematology/Lymphatic: N/A  Endocrine: N/A  Musculoskeletal: N/A  Allergy/Immunology: N/A  Neurological: N/A  Psychiatric: N/A  The following represents the patient's updated allergies and side effects list: Allergies  Allergen Reactions  . Penicillins Other (See Comments)    Unknown, unsure  . Sulfa Antibiotics Other (See Comments)    Unknown, unsure  . Diltiazem Rash    Patient is unaware of reaction but states to keep on list.    The neurologically relevant items on the patient's problem list were reviewed on today's visit.  Neurologic Examination  A problem focused neurological exam (12 or more points of the single system neurologic examination, vital signs counts as 1 point, cranial nerves count for 8 points) was performed.  Blood pressure 158/81, pulse 76, weight 191 lb 6.4 oz (86.818 kg).  General - Well nourished, well developed, in no apparent distress.  Ophthalmologic - Sharp disc margins OU.  Cardiovascular - Regular rate and rhythm with no murmur.  Mental Status -  Level of arousal and orientation to time, place, and person were intact. Language including expression, naming,  repetition, comprehension was assessed and found intact. MMSE 29/30  Cranial Nerves II - XII - II - Visual field intact OU. III, IV, VI - Extraocular movements intact. V - Facial sensation intact bilaterally. VII - Facial movement intact bilaterally. VIII - Hearing & vestibular intact bilaterally. X - Palate elevates symmetrically. XI - Chin turning & shoulder  shrug intact bilaterally. XII - Tongue protrusion intact.  Motor Strength - The patient's strength was normal in all extremities and pronator drift was absent.  Bulk was normal and fasciculations were absent.   Motor Tone - Muscle tone was assessed at the neck and appendages and was normal.  Reflexes - The patient's reflexes were normal in all extremities and he had no pathological reflexes.  Sensory - Light touch, temperature/pinprick, vibration and proprioception, and Romberg testing were assessed and were normal.    Coordination - The patient had normal movements in the hands and feet with no ataxia or dysmetria.  Tremor was absent.  Gait and Station - The patient's transfers, posture, gait, station, and turns were observed as normal.  Data reviewed: I personally reviewed the images and agree with the radiology interpretations.  Ct Head (brain) Wo Contrast  09/23/2013 Left thalamic hematoma unchanged. Intraventricular hemorrhage again  noted without hydrocephalus. Mild midline shift to the right  measuring 5 mm unchanged.  09/22/2013 Acute intraparenchymal hemorrhage at LEFT thalamus 3.3 x 1.7 cm with evidence of intraventricular extension and mild LEFT-to-RIGHT midline shift. Small vessel chronic ischemic changes of deep cerebral white matter. 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  2D echo  - Left ventricle: The cavity size was normal. Wall thickness was increased in a pattern of severe LVH. Systolic function was normal. The estimated ejection fraction was in the range of 50% to 55%. Wall motion was normal; there were no regional wall motion abnormalities. - Left atrium: The atrium was moderately dilated. - Right atrium: The atrium was moderately dilated.  CUS 1-39% ICA stenosis. Vertebral artery flow is antegrade  Renal Artery Duplex -  - No evidence of right renal artery stenosis. - Left renal artery not insonated.  Component     Latest Ref  Rng 09/23/2013 09/25/2013 02/15/2014  Cholesterol     0 - 200 mg/dL 409169    Triglycerides     <150 mg/dL 78    HDL Cholesterol     >39 mg/dL 44    Total CHOL/HDL Ratio      3.8    VLDL     0 - 40 mg/dL 16    LDL (calc)     0 - 99 mg/dL 811109 (H)    Norepinephrine     0 - 874 pg/mL  858 539  Epinephrine     0 - 62 pg/mL  172 85 (H)  Dopamine     0 - 48 pg/mL  REPORT 36  Total Catecholamines (Nor+Epi)       1030   PRA LC/MS/MS     0.25 - 5.82 ng/mL/h  0.27   ALDO / PRA Ratio     0.9 - 28.9 Ratio  14.8   ALDOSTERONE       4   Normetanephrine, Free     0 - 145 pg/mL  214 (H) 173 (H)  Metanephrine, Free     0 - 62 pg/mL  80 (H) 30  Total Metanephrines-Plasma     <=205 pg/mL  294 (H)   Hemoglobin A1C     <5.7 % 5.6  Mean Plasma Glucose     <117 mg/dL 161    Free T4     0.96 - 1.80 ng/dL 0.45    TSH     4.098 - 4.500 uIU/mL 0.812      Assessment: As you may recall, he is a 54 y.o. 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 His BP was found to be very high. Was put on 5 different BP meds. Renal artery ultrasound and renin/aldosterone normal. Metanephrine just mildly high, likely due to ICH at that time, repeat level was normal. BP good at home but still high in clinic, continue to check BP at home. Continue ASA and lipitor.   Plan:  - continue ASA and lipitor for stroke prevention - continue BP meds for BP control - check BP at home twice a day and record and BP goal < 140 - Follow up with your primary care physician for stroke risk factor modification. Recommend maintain blood pressure goal <140/80, diabetes with hemoglobin A1c goal below 6.5% and lipids with LDL cholesterol goal below 70 mg/dL.  - RTC in 6 months.  No orders of the defined types were placed in this encounter.    Meds ordered this encounter  Medications  . fluticasone (FLONASE) 50  MCG/ACT nasal spray    Sig: Place into the nose.    Patient Instructions  - continue ASA and lipitor for stroke prevention - continue BP meds for BP control - check BP at home twice a day and record and BP goal < 140 - Follow up with your primary care physician for stroke risk factor modification. Recommend maintain blood pressure goal <140/80, diabetes with hemoglobin A1c goal below 6.5% and lipids with LDL cholesterol goal below 70 mg/dL.  - follow up in 6 months.    Marvel Plan, MD PhD [facility] [address], [address] [place], [place] [address] [phone]

## 2014-05-24 NOTE — Patient Instructions (Signed)
-   continue ASA and lipitor for stroke prevention - continue BP meds for BP control - check BP at home twice a day and record and BP goal < 140 - Follow up with your primary care physician for stroke risk factor modification. Recommend maintain blood pressure goal <140/80, diabetes with hemoglobin A1c goal below 6.5% and lipids with LDL cholesterol goal below 70 mg/dL.  - follow up in 6 months.

## 2014-06-01 ENCOUNTER — Ambulatory Visit (HOSPITAL_BASED_OUTPATIENT_CLINIC_OR_DEPARTMENT_OTHER): Payer: BC Managed Care – PPO | Admitting: Physical Medicine & Rehabilitation

## 2014-06-01 ENCOUNTER — Encounter: Payer: Self-pay | Admitting: Physical Medicine & Rehabilitation

## 2014-06-01 ENCOUNTER — Encounter: Payer: BC Managed Care – PPO | Attending: Physical Medicine & Rehabilitation

## 2014-06-01 VITALS — BP 132/76 | HR 75 | Resp 14

## 2014-06-01 DIAGNOSIS — I69398 Other sequelae of cerebral infarction: Secondary | ICD-10-CM

## 2014-06-01 DIAGNOSIS — I619 Nontraumatic intracerebral hemorrhage, unspecified: Secondary | ICD-10-CM | POA: Insufficient documentation

## 2014-06-01 DIAGNOSIS — I69359 Hemiplegia and hemiparesis following cerebral infarction affecting unspecified side: Secondary | ICD-10-CM | POA: Insufficient documentation

## 2014-06-01 DIAGNOSIS — I69320 Aphasia following cerebral infarction: Secondary | ICD-10-CM | POA: Diagnosis not present

## 2014-06-01 DIAGNOSIS — I69120 Aphasia following nontraumatic intracerebral hemorrhage: Secondary | ICD-10-CM | POA: Diagnosis not present

## 2014-06-01 NOTE — Progress Notes (Signed)
Subjective:    Patient ID: Jay Wise A Lank, male    DOB: [birth year], 54 y.o.   MRN: [id number] 54 y.o. right handed male with history of untreated hypertension, tobacco abuse; who was admitted 09/22/2013 with right-sided weakness and aphasia. Systolic blood pressure 210 at admisison. CT of the brain in the left thalamus 3.3 x 1.7 cm intraventricular extension and mild left-to-right midline shift but no hydrocephalus. Followup cranial CT scan 09/23/2013 unchanged. Dr. 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 We bumped him up to 8. About 2 weeks ago he was laid off. Said his company had  Hired other  employees while he was out sick. He has followed up with vocational rehabilitation and is filing for unemployment insurance.  Occ naming deficit, Right leg feels stiff when he first gets up, still with mild sensory problems in the right hand and also has some decreased coordination right hand. His wife does not note any major problems with his communication.  Pain Inventory Average Pain 0 Pain Right Now 0 My pain is no pain  In the last 24 hours, has pain interfered with the following? General activity 0 Relation with others 0 Enjoyment of life 0 What TIME of day is your pain at its worst? no pain Sleep (in general) Good  Pain is worse with: no pain Pain improves with: no pain Relief from Meds: no pain  Mobility walk without assistance  Function not employed: date last employed .  Neuro/Psych No problems in this area  Prior Studies Any changes since last visit?  no  Physicians involved in your care Any changes since last visit?  no   Family History  Problem Relation Age of Onset  . Cancer Father    History   Social History  . Marital Status: Married    Spouse Name: Irving Burtonmily  . Number of Children:  2  . Years of Education: 12   Occupational History  . PARTS SALES    Social History Main Topics  . Smoking status: Former Smoker    Types: Cigarettes    Quit date: 09/18/2013  . Smokeless tobacco: Never Used  . Alcohol Use: No  . Drug Use: No  . Sexual Activity: Not on file   Other Topics Concern  . None   Social History Narrative   Patient is right handed and consumes 4-5 20oz daily   Past Surgical History  Procedure Laterality Date  . Ankle surgery Right 2000    2 skrews  . Rotator cuff repair Right 2009   Past Medical History  Diagnosis Date  . Hypertension   . Asthma   . Stroke    BP 132/76 mmHg  Pulse 75  Resp 14  SpO2 98%  Opioid Risk Score:   Fall Risk Score:  `1  Depression screen PHQ 2/9  No flowsheet data found.   Review of Systems  All other systems reviewed and are negative.      Objective:   Physical Exam  Motor strength is 4/5 in the right deltoid, biceps, triceps, grip, hip flexor, knee extensor, ankle dorsi flexor and plantar flexion 5/5 and left-sided Sensation he is able to identify light touch on the fingers however he feels different on the right hand versus left hand Finger thumb opposition is slower on the right side than on the  left side Gait shows no evidence of toe drag or knee instability he does have stifflegged gait in the right lower extremity. Speech has mild hesitancy     Assessment & Plan:  1. Left thalamic hemorrhage with mild residual right-sided weakness, decreased coordination as well as sensory deficits. He has a mild anomia. I would not recommend any type of work beyond light physical demand level given that I do not think he can carry heavier objects frequently during the day. We discussed his prognosis for further improvement. In terms of his anal Mc a fascia I do think he should get some further improvement over the next several months. From a physical standpoint I think he has plateaued in terms of his  neurologic deficits  Would support vocational rehabilitation efforts. I think he can work full-time with some restrictionsI.e. No more than 20 pounds lifting and carrying  Physical mastery built patient follow up on a when necessary basis Follow-up neurology in November  We may need to see him back if there is a form that requires a current examination to fill out

## 2014-06-01 NOTE — Patient Instructions (Signed)
Go to gym 3 times a week.

## 2014-11-24 ENCOUNTER — Ambulatory Visit: Payer: BC Managed Care – PPO | Admitting: Neurology

## 2014-12-08 ENCOUNTER — Ambulatory Visit (INDEPENDENT_AMBULATORY_CARE_PROVIDER_SITE_OTHER): Payer: BC Managed Care – PPO | Admitting: Neurology

## 2014-12-08 ENCOUNTER — Encounter: Payer: Self-pay | Admitting: Neurology

## 2014-12-08 VITALS — BP 140/78 | HR 61 | Ht 72.0 in | Wt 176.0 lb

## 2014-12-08 DIAGNOSIS — I10 Essential (primary) hypertension: Secondary | ICD-10-CM | POA: Diagnosis not present

## 2014-12-08 DIAGNOSIS — E785 Hyperlipidemia, unspecified: Secondary | ICD-10-CM | POA: Diagnosis not present

## 2014-12-08 DIAGNOSIS — I619 Nontraumatic intracerebral hemorrhage, unspecified: Secondary | ICD-10-CM | POA: Diagnosis not present

## 2014-12-08 NOTE — Progress Notes (Signed)
STROKE NEUROLOGY FOLLOW UP NOTE  NAME: Jay Wise DOB: April 19, 1960  REASON FOR VISIT: stroke follow up HISTORY FROM: chart and wife and pt  Today we had the pleasure of seeing Jay Wise in follow-up at our Neurology Clinic. Pt was accompanied by wife.   History Summary 54 y.o. right handed male with history of untreated hypertension, tobacco abuse was admitted on 09/22/2013 with right-sided weakness and aphasia. Systolic blood pressure BP on arrival 242/148 at [facility]. No home meds. CT of the brain in the left thalamus 3.3 x 1.7 cm intraventricular extension and mild left-to-right midline shift but no hydrocephalus. Followup cranial CT scan 09/23/2013 unchanged. 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 He was put on 5 po meds for BP control. He was then admitted to the inpatient rehabilitation on 09/25/2013 and was discharged on 10/07/2013.  Follow up on 11/09/13 (LL) - has been getting outpatient therapy. Ambulating outside without assisted device. He has some residual right-sided sensory deficits and mild weakness in the right leg. He has not resumed smoking. He denies falls. He complains of problems with recalling words and short term memory difficulties. He states his blood pressure is well controlled, but he is currently taking 5 different antihypertensives. He has not returned to driving and has not returned to work.  Follow up 02/15/14 - the patient has been doing well. He stated that he has now back to his baseline. He has returned to part time work and feels good. He did not check BP at home and today BP is 160/78. He has PCP follow up in 03/2014. He continued not smoking. Denies symptoms of OSA from wife and pt.  Follow up 05/24/14 - pt has been doing well. On 5 BP meds for BP control. Pt and wife said when at home, BP check was 120-130s, but today in clinic 158/51. He said that he has "white coat" syndrome. He was recently laid off due to not  perform as good as before. He is looking for new job now. He is on ASA and lipitor without side effects.  Interval History During the interval time, pt has been the same. BP under control, at home 120-130s. Today in clinic, 140/78. He is still on the 5 BP meds. Complain of 20lb weight loss for the last 6 months, but for the last 2 months, weight is stabilized. Has been following with PCP for this issue. Pending colonoscopy and PSA screening. Still has right eye vision acuity decreased but stable since last visit. Has not seen eye doctor yet.   REVIEW OF SYSTEMS: Full 14 system review of systems performed and notable only for those listed below and in HPI above, all others are negative:  Constitutional: N/A  Cardiovascular: N/A  Ear/Nose/Throat: N/A  Skin: N/A  Eyes: N/A  Respiratory: N/A  Gastroitestinal: N/A  Genitourinary: N/A Hematology/Lymphatic: N/A  Endocrine: N/A  Musculoskeletal: N/A  Allergy/Immunology: N/A  Neurological: N/A  Psychiatric: N/A  The following represents the patient's updated allergies and side effects list: Allergies  Allergen Reactions  . Penicillins Other (See Comments)    Unknown, unsure  . Sulfa Antibiotics Other (See Comments)    Unknown, unsure  . Diltiazem Rash    Patient is unaware of reaction but states to keep on list.    The neurologically relevant items on the patient's problem list were reviewed on today's visit.  Neurologic Examination  A problem focused neurological exam (12 or more points of the single  system neurologic examination, vital signs counts as 1 point, cranial nerves count for 8 points) was performed.  There were no vitals taken for this visit.  General - Well nourished, well developed, in no apparent distress.  Ophthalmologic - Sharp disc margins OU.  Cardiovascular - Regular rate and rhythm with no murmur.  Mental Status -  Level of arousal and orientation to time, place, and person were intact. Language including  expression, naming, repetition, comprehension was assessed and found intact. MMSE 29/30  Cranial Nerves II - XII - II - Visual field intact OU. Right eye visual acuity decreased to barely finger counting. III, IV, VI - Extraocular movements intact. V - Facial sensation intact bilaterally. VII - Facial movement intact bilaterally. VIII - Hearing & vestibular intact bilaterally. X - Palate elevates symmetrically. XI - Chin turning & shoulder shrug intact bilaterally. XII - Tongue protrusion intact.  Motor Strength - The patient's strength was normal in all extremities and pronator drift was absent.  Bulk was normal and fasciculations were absent.   Motor Tone - Muscle tone was assessed at the neck and appendages and was normal.  Reflexes - The patient's reflexes were normal in all extremities and he had no pathological reflexes.  Sensory - Light touch, temperature/pinprick, vibration and proprioception, and Romberg testing were assessed and were normal.    Coordination - The patient had normal movements in the hands and feet with no ataxia or dysmetria.  Tremor was absent.  Gait and Station - mild right hemiparetic gait.  Data reviewed: I personally reviewed the images and agree with the radiology interpretations.  Ct Head (brain) Wo Contrast  09/23/2013 Left thalamic hematoma unchanged. Intraventricular hemorrhage again  noted without hydrocephalus. Mild midline shift to the right  measuring 5 mm unchanged.  09/22/2013 Acute intraparenchymal hemorrhage at LEFT thalamus 3.3 x 1.7 cm with evidence of intraventricular extension and mild LEFT-to-RIGHT midline shift. Small vessel chronic ischemic changes of deep cerebral white matter. 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  2D echo  - Left ventricle: The cavity size was normal. Wall thickness was increased in a pattern of severe LVH. Systolic function was normal. The estimated ejection fraction was in the  range of 50% to 55%. Wall motion was normal; there were no regional wall motion abnormalities. - Left atrium: The atrium was moderately dilated. - Right atrium: The atrium was moderately dilated.  CUS 1-39% ICA stenosis. Vertebral artery flow is antegrade  Renal Artery Duplex -  - No evidence of right renal artery stenosis. - Left renal artery not insonated.  Component     Latest Ref Rng 09/23/2013 09/25/2013 02/15/2014  Cholesterol     0 - 200 mg/dL 960169    Triglycerides     <150 mg/dL 78    HDL Cholesterol     >39 mg/dL 44    Total CHOL/HDL Ratio      3.8    VLDL     0 - 40 mg/dL 16    LDL (calc)     0 - 99 mg/dL 454109 (H)    Norepinephrine     0 - 874 pg/mL  858 539  Epinephrine     0 - 62 pg/mL  172 85 (H)  Dopamine     0 - 48 pg/mL  REPORT 36  Total Catecholamines (Nor+Epi)       1030   PRA LC/MS/MS     0.25 - 5.82 ng/mL/h  0.27   ALDO / PRA  Ratio     0.9 - 28.9 Ratio  14.8   ALDOSTERONE       4   Normetanephrine, Free     0 - 145 pg/mL  214 (H) 173 (H)  Metanephrine, Free     0 - 62 pg/mL  80 (H) 30  Total Metanephrines-Plasma     <=205 pg/mL  294 (H)   Hemoglobin A1C     <5.7 % 5.6    Mean Plasma Glucose     <117 mg/dL 161    Free T4     0.96 - 1.80 ng/dL 0.45    TSH     4.098 - 4.500 uIU/mL 0.812      Assessment: As you may recall, he is a 54 y.o. 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 His BP was found to be very high. Was put on 5 different BP meds. Renal artery ultrasound and renin/aldosterone normal. Metanephrine just mildly high, likely due to ICH at that time, repeat level was normal. BP good at home but still high in clinic, continue to check BP at home. Continue ASA and lipitor. Recommend ophthalmology visit for eye glasses. Has weight loss but stabilized.  Plan:  - continue ASA and lipitor for stroke prevention - continue  BP meds for BP control - check BP at home and BP goal < 140 - Follow up with your primary care physician for stroke risk factor modification. Recommend maintain blood pressure goal <140/80, diabetes with hemoglobin A1c goal below 6.5% and lipids with LDL cholesterol goal below 70 mg/dL.  - check body weight closely and discuss with PCP if continued weight loss - follow up as needed.  No orders of the defined types were placed in this encounter.    No orders of the defined types were placed in this encounter.    There are no Patient Instructions on file for this visit.  Marvel Plan, MD PhD [facility] [address] [place], [place] [address] [phone]

## 2014-12-08 NOTE — Patient Instructions (Addendum)
-   continue ASA and lipitor for stroke prevention - continue BP meds for BP control - check BP at home and BP goal < 140 - Follow up with your primary care physician for stroke risk factor modification. Recommend maintain blood pressure goal <140/80, diabetes with hemoglobin A1c goal below 6.5% and lipids with LDL cholesterol goal below 70 mg/dL.  - check body weight closely and discuss with PCP if continued weight loss - follow up as needed.

## 2015-02-21 ENCOUNTER — Other Ambulatory Visit: Payer: Self-pay | Admitting: Neurology

## 2015-02-22 ENCOUNTER — Other Ambulatory Visit: Payer: Self-pay

## 2015-02-22 DIAGNOSIS — I619 Nontraumatic intracerebral hemorrhage, unspecified: Secondary | ICD-10-CM

## 2015-02-22 MED ORDER — ATORVASTATIN CALCIUM 10 MG PO TABS: 10.0000 mg | ORAL_TABLET | Freq: Every day | ORAL | Status: AC

## 2016-05-26 ENCOUNTER — Other Ambulatory Visit: Payer: Self-pay | Admitting: Neurology

## 2016-05-26 DIAGNOSIS — I619 Nontraumatic intracerebral hemorrhage, unspecified: Secondary | ICD-10-CM

## 2016-07-17 IMAGING — CT CT HEAD W/O CM
2 series · 15 of 30 positions shown, 19 images · non-contrast
Comparison: None.

CLINICAL DATA: Code stroke, RIGHT-sided weakness, LEFT facial droop

EXAM:
CT HEAD WITHOUT CONTRAST
TECHNIQUE: Contiguous axial images were obtained from the base of the skull
through the vertex without intravenous contrast.

[Series 201: head w/o, idose (1) · axial · non-contrast · 0.49mm/px · z∈[+103,+233]mm · 13 of 32 slices shown, 17 images]
[im 3/32  brain]
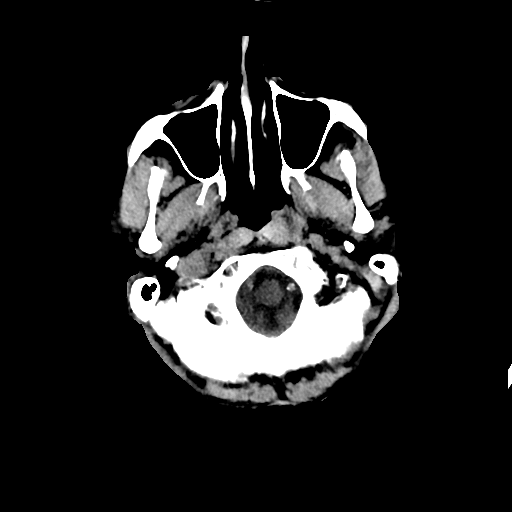
[im 3/32  bone]
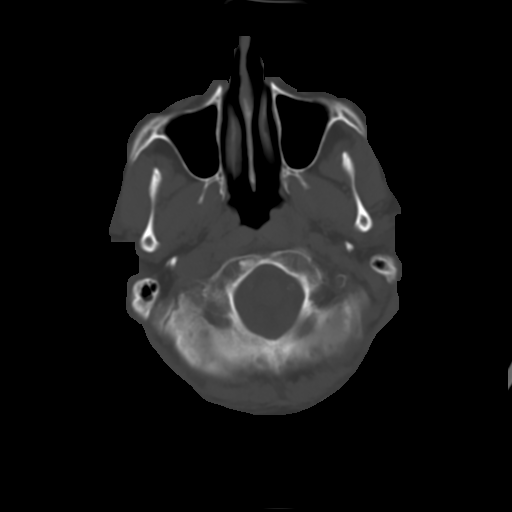
[im 5/32  brain]
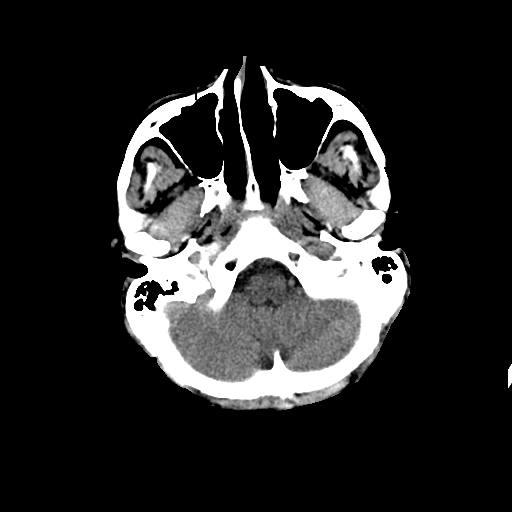
[im 7/32  brain]
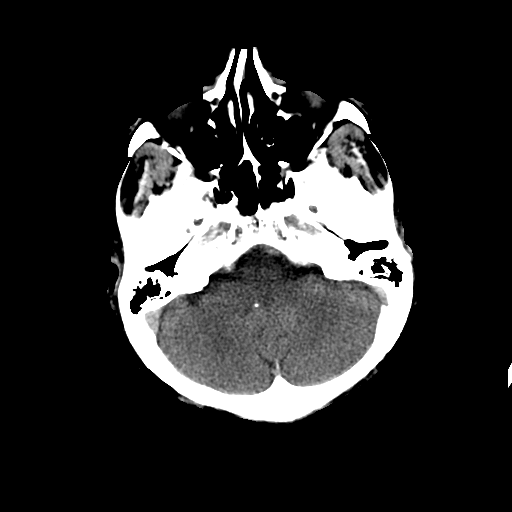
[im 9/32  brain]
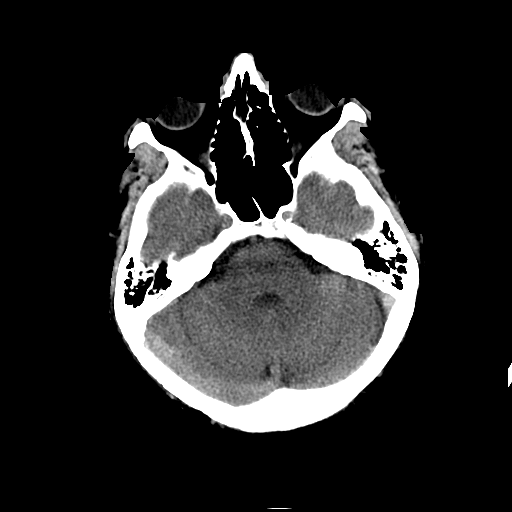
[im 12/32  brain]
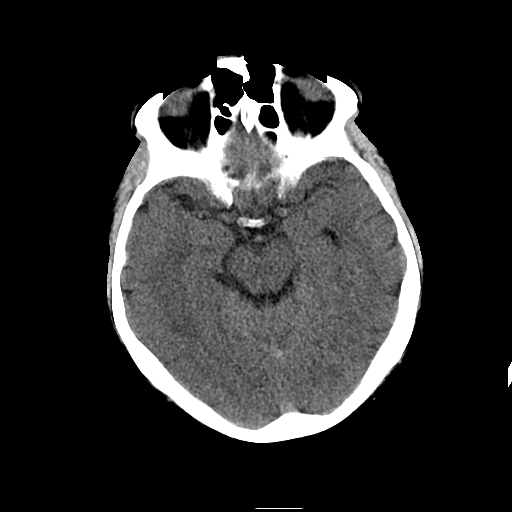
[im 12/32  bone]
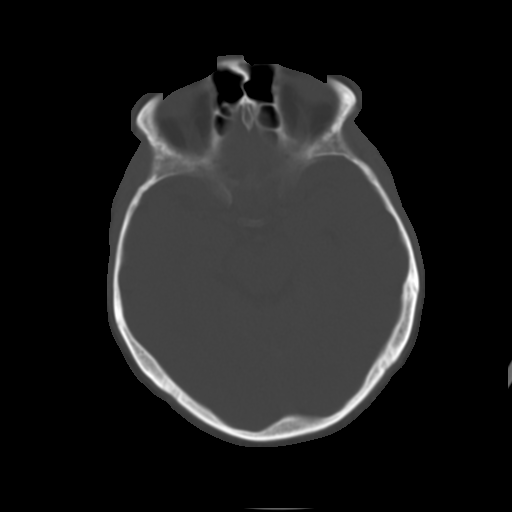
[im 14/32  brain]
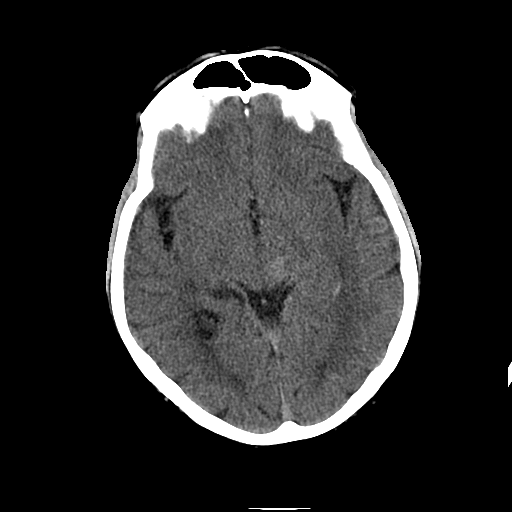
[im 16/32  brain]
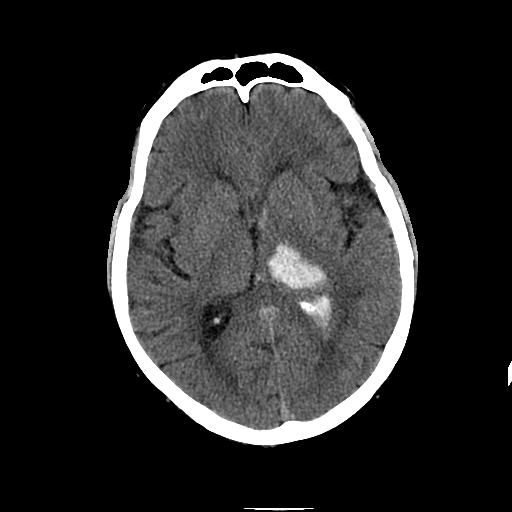
[im 18/32  brain]
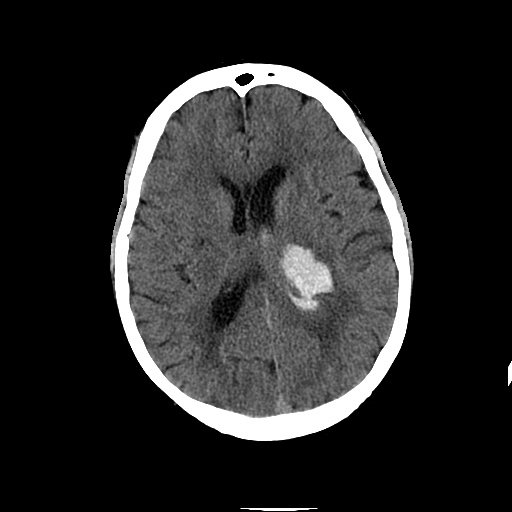
[im 20/32  brain]
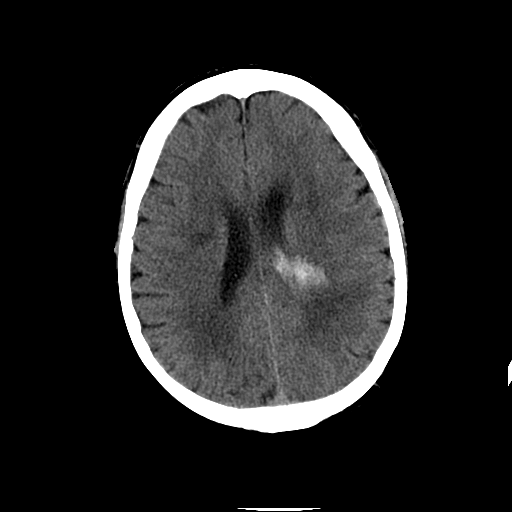
[im 20/32  bone]
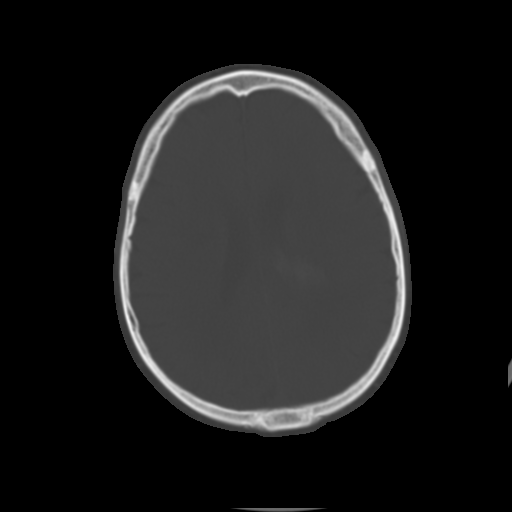
[im 23/32  brain]
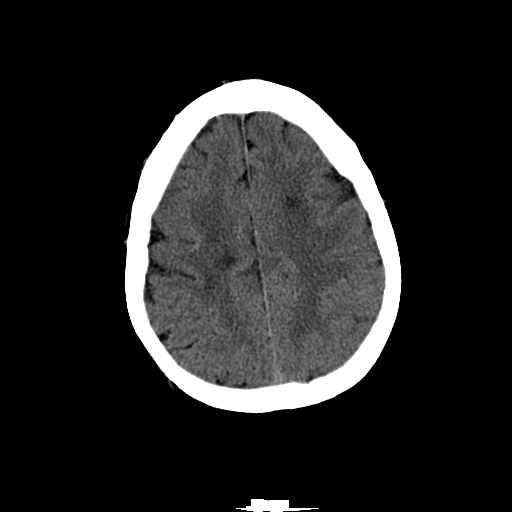
[im 25/32  brain]
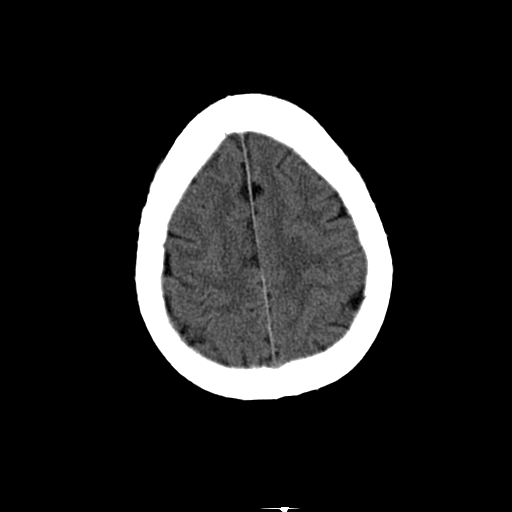
[im 27/32  brain]
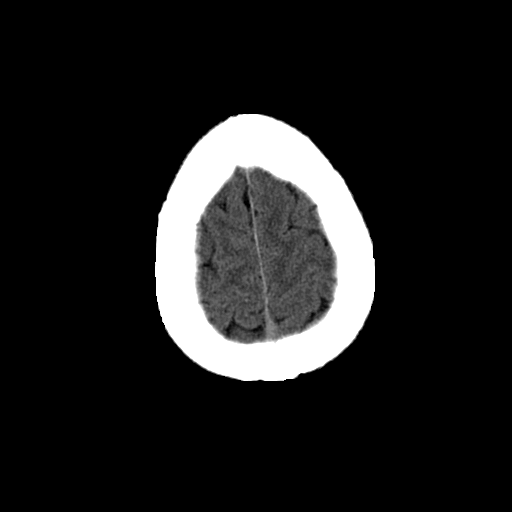
[im 29/32  brain]
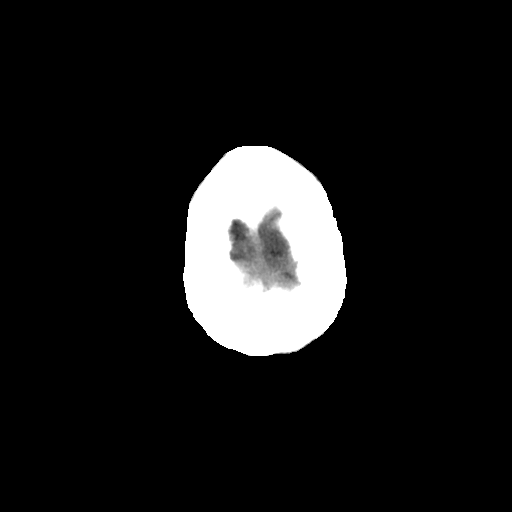
[im 29/32  bone]
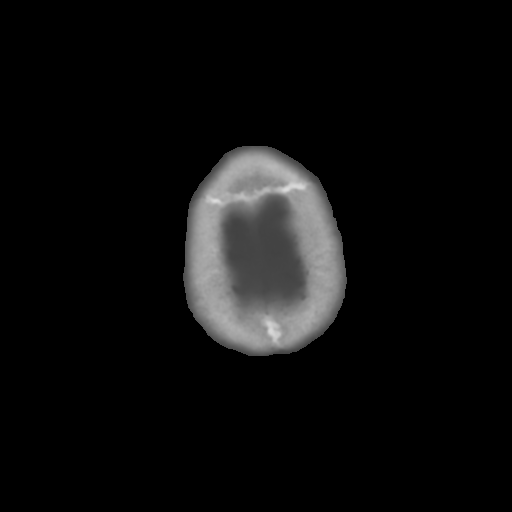

[Series 202: head w/o bone, idose (1) · axial · non-contrast · 0.49mm/px · z∈[+103,+123]mm · 2 of 32 slices shown]
[im 3/32  bone]
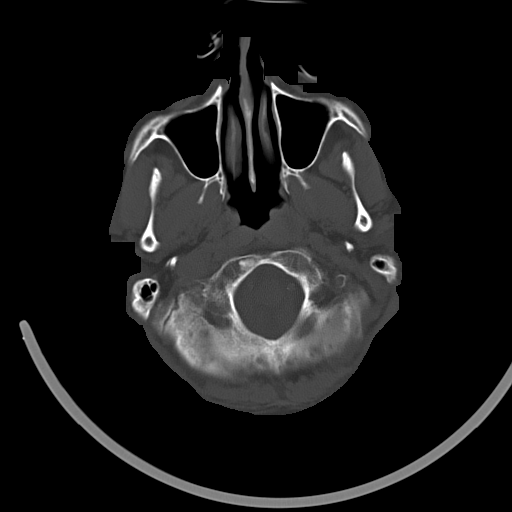
[im 7/32  bone]
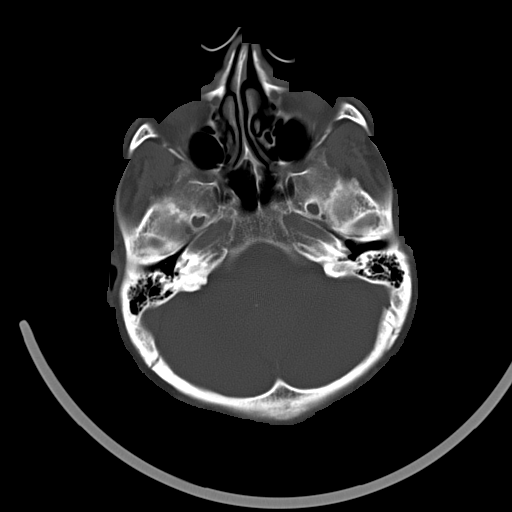

[15 of 30 positions shown; findings below may reference images not displayed]

FINDINGS: Generalized atrophy.

Scattered small vessel chronic ischemic changes of deep cerebral
white matter.

Large area of intraparenchymal hemorrhage identified, question
originating from LEFT thalamus extending into white matter,
measuring approximately 3.3 x 1.7 cm image 17.

Intraventricular extension of hemorrhage into LEFT lateral
ventricle.

5 mm of LEFT-to-RIGHT midline shift.

No definite hydrocephalus.

Question small old white matter infarct RIGHT periventricular.

Suspect old small LEFT caudate lacunar infarct.

No additional intracranial hemorrhage or extra-axial collections.

No focal mass lesion or additional evidence of infarction.

Bones and sinuses unremarkable.
IMPRESSION: Acute intraparenchymal hemorrhage at LEFT thalamus 3.3 x 1.7 cm with
evidence of intraventricular extension and mild LEFT-to-RIGHT
midline shift.

Small vessel chronic ischemic changes of deep cerebral white matter.

Probable old RIGHT periventricular and LEFT caudate lacunar
infarcts.

Critical Value/emergent results were called by telephone at the time
of interpretation on 09/22/2013 at [DATE] to Dr. Angele, who verbally
acknowledged these results.

## 2016-07-18 IMAGING — CT CT HEAD W/O CM
1 series · 16 of 30 positions shown, 20 images · non-contrast
Comparison: CT 09/22/2013

CLINICAL DATA: Followup intracranial hemorrhage

EXAM:
CT HEAD WITHOUT CONTRAST
TECHNIQUE: Contiguous axial images were obtained from the base of the skull
through the vertex without intravenous contrast.

[Series 2: head 5.0 h30s · axial · 0.43mm/px · z∈[-125,+10]mm · 16 of 31 slices shown, 20 images]
[im 2/31  brain]
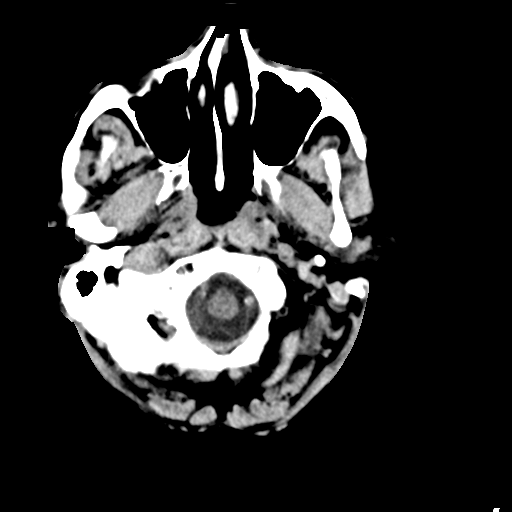
[im 2/31  bone]
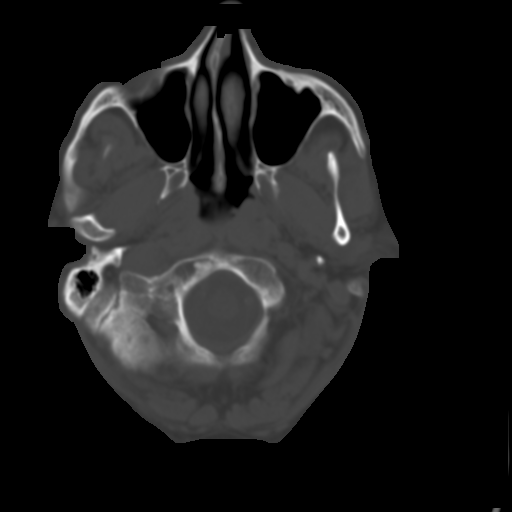
[im 4/31  brain]
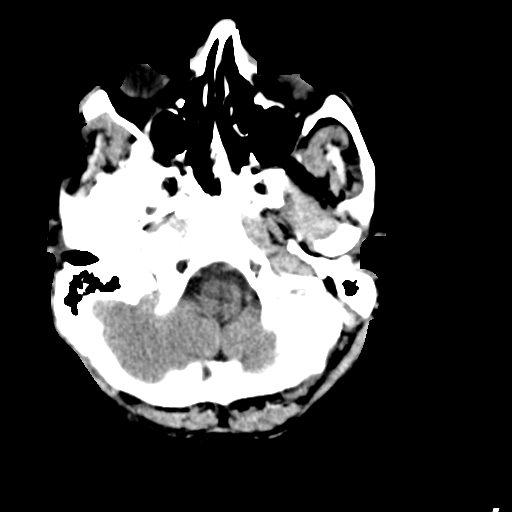
[im 6/31  brain]
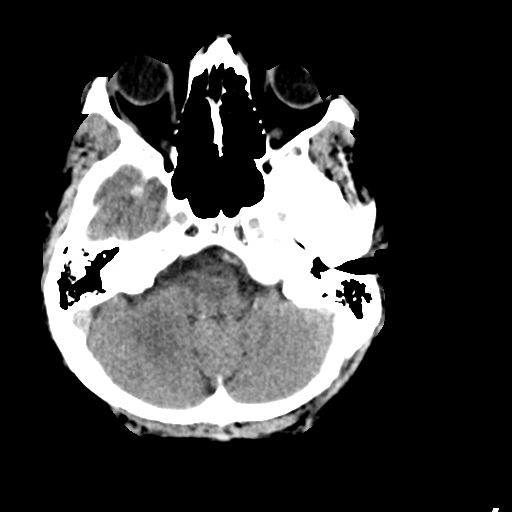
[im 8/31  brain]
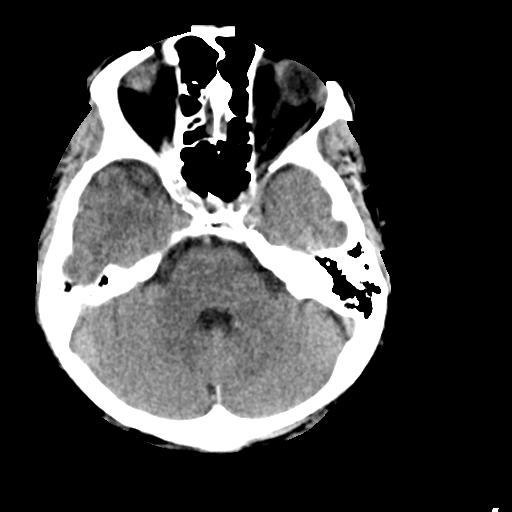
[im 9/31  brain]
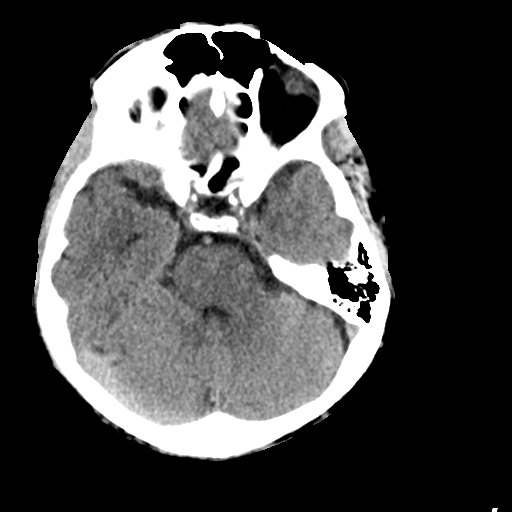
[im 9/31  bone]
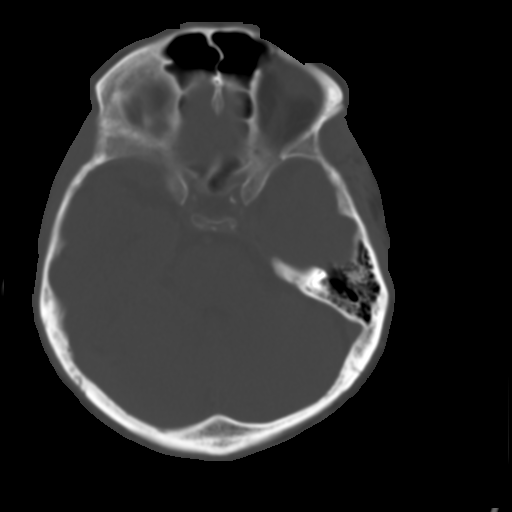
[im 11/31  brain]
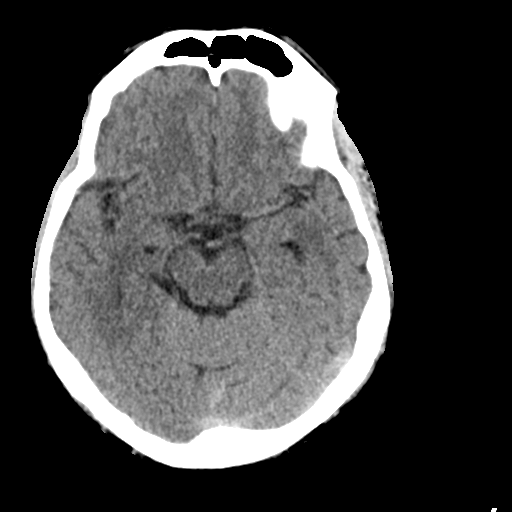
[im 13/31  brain]
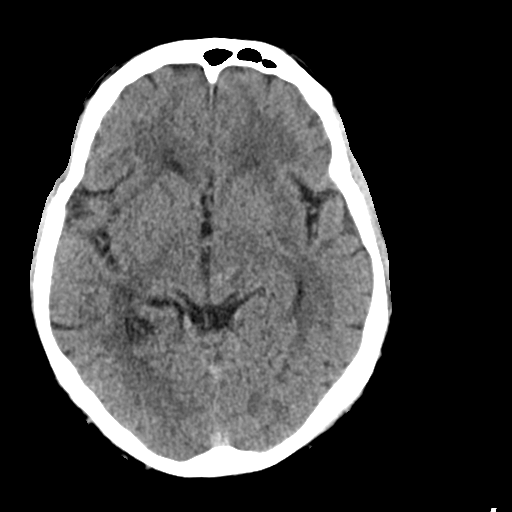
[im 15/31  brain]
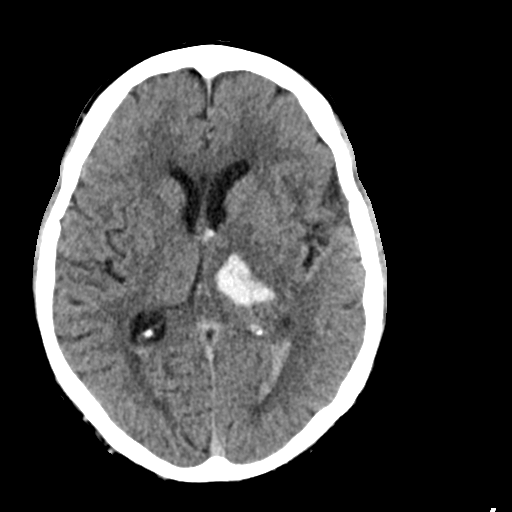
[im 16/31  brain]
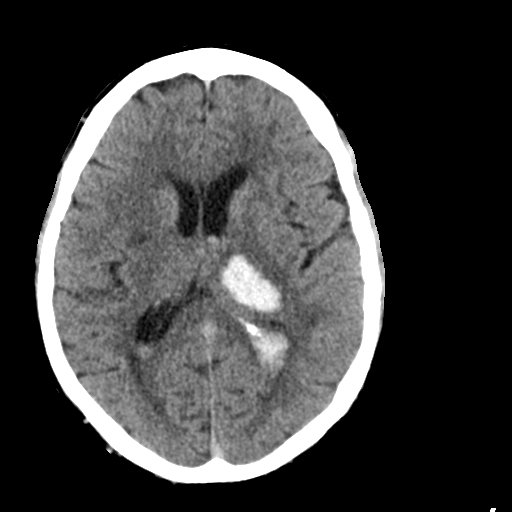
[im 16/31  bone]
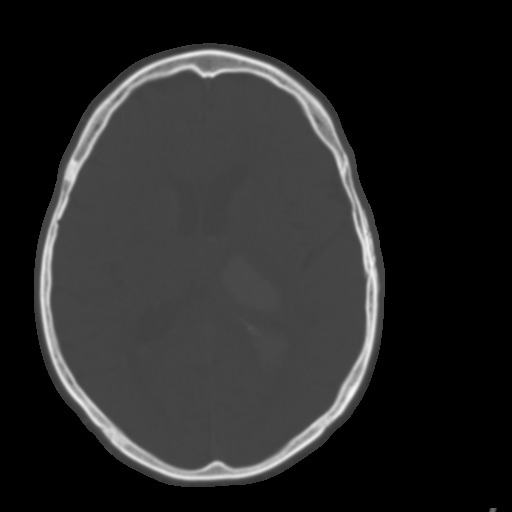
[im 18/31  brain]
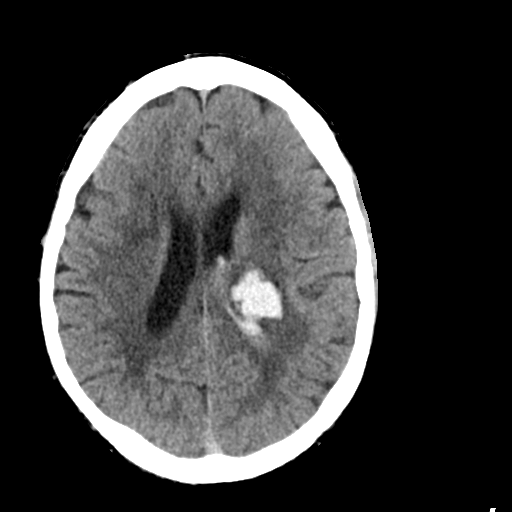
[im 20/31  brain]
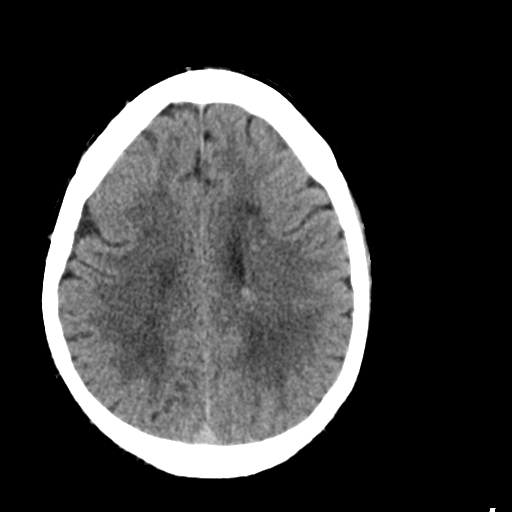
[im 22/31  brain]
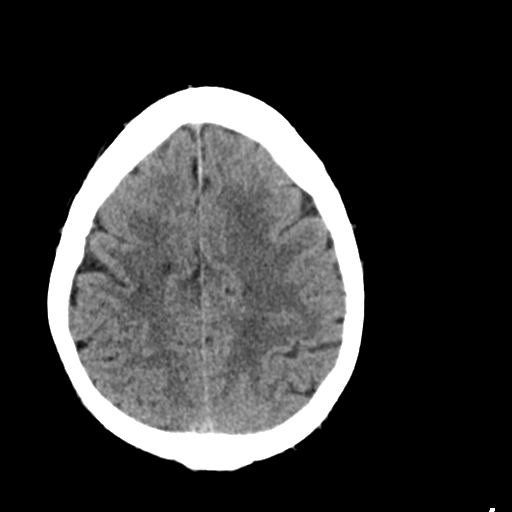
[im 23/31  brain]
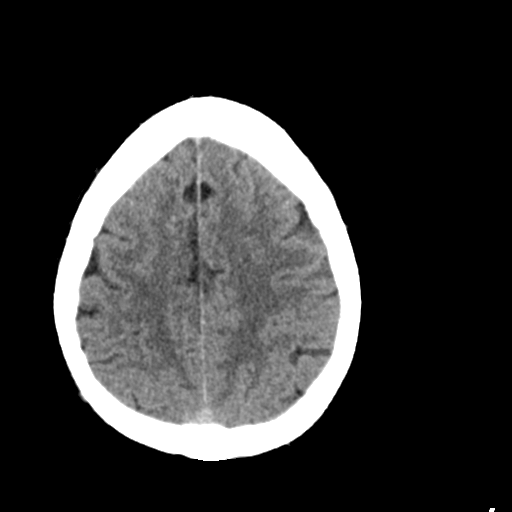
[im 23/31  bone]
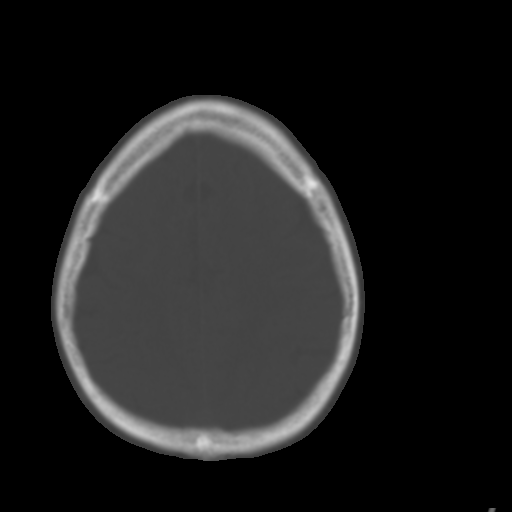
[im 25/31  brain]
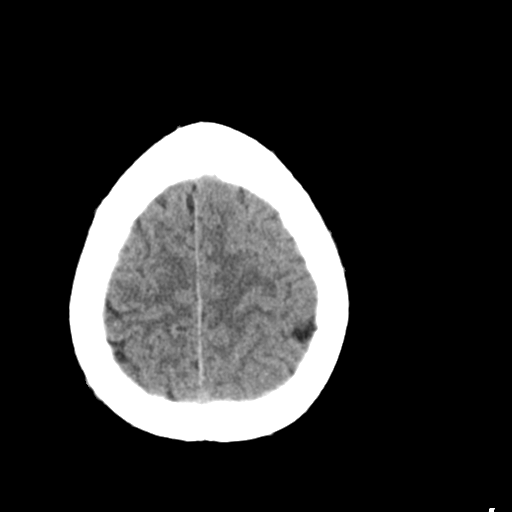
[im 27/31  brain]
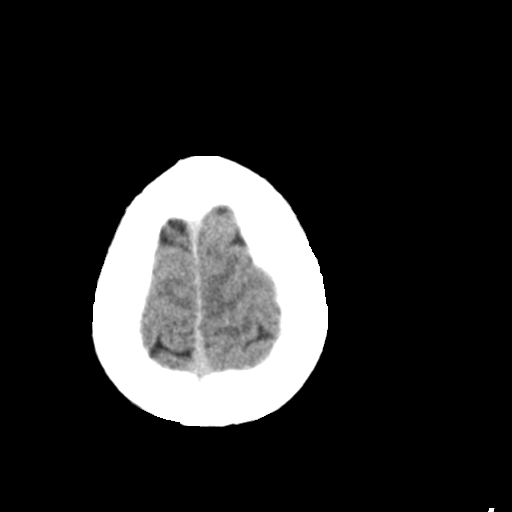
[im 29/31  brain]
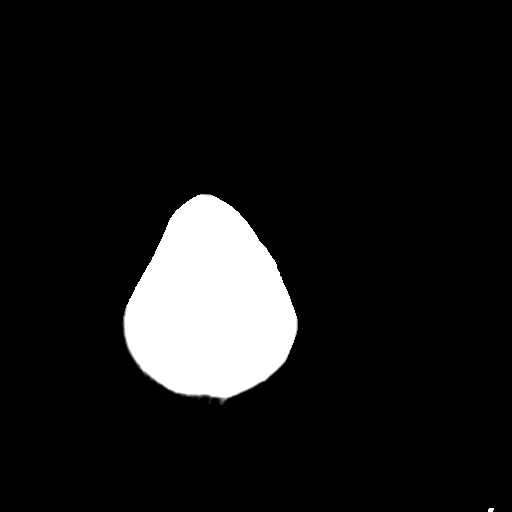

[16 of 30 positions shown; findings below may reference images not displayed]

FINDINGS: Left thalamic high-density hematoma unchanged. This measures 16 x 29
mm. There is intraventricular extension. Blood in the left lateral
ventricle unchanged. Slight increased blood in the right lateral
ventricle and third ventricle.

5 mm midline shift to the right is unchanged.  No hydrocephalus

Chronic microvascular ischemic changes in the white matter. No acute
infarct. Negative for underlying mass or edema.
IMPRESSION: Left thalamic hematoma unchanged. Intraventricular hemorrhage again
noted without hydrocephalus. Mild midline shift to the right
measuring 5 mm unchanged.

## 2018-01-20 ENCOUNTER — Other Ambulatory Visit: Payer: Self-pay | Admitting: Family Medicine

## 2018-01-20 ENCOUNTER — Ambulatory Visit
Admission: RE | Admit: 2018-01-20 | Discharge: 2018-01-20 | Disposition: A | Discharge status: Home / Self Care | Payer: BC Managed Care – PPO | Source: Ambulatory Visit | Attending: Family Medicine | Admitting: Family Medicine

## 2018-01-20 DIAGNOSIS — R918 Other nonspecific abnormal finding of lung field: Secondary | ICD-10-CM

## 2018-01-20 LAB — POCT I-STAT CREATININE: CREATININE: 1.7 mg/dL — AB (ref 0.61–1.24)

## 2018-01-20 MED ORDER — IOPAMIDOL (ISOVUE-300) INJECTION 61%
75.0000 mL | Freq: Once | INTRAVENOUS | Status: AC | PRN
  Administered 2018-01-20: 60 mL via INTRAVENOUS

## 2018-04-23 DEATH — deceased
